# Patient Record
Sex: Female | Born: 1975 | Race: Black or African American | Hispanic: No | Marital: Married | Smoking: Never smoker
Health system: Southern US, Community
[De-identification: ages and names within clinical notes are randomized; demographics above are authoritative.]

## PROBLEM LIST (undated history)

## (undated) HISTORY — PX: KNEE SURGERY: SHX244

## (undated) HISTORY — PX: CHOLECYSTECTOMY: SHX55

---

## 2016-10-08 ENCOUNTER — Emergency Department (HOSPITAL_COMMUNITY)
Admission: EM | Admit: 2016-10-08 | Discharge: 2016-10-08 | Disposition: A | Discharge status: Home / Self Care | Payer: Medicaid Other | Attending: Emergency Medicine | Admitting: Emergency Medicine

## 2016-10-08 ENCOUNTER — Encounter (HOSPITAL_COMMUNITY): Payer: Self-pay | Admitting: Emergency Medicine

## 2016-10-08 DIAGNOSIS — H6122 Impacted cerumen, left ear: Secondary | ICD-10-CM | POA: Insufficient documentation

## 2016-10-08 DIAGNOSIS — M62838 Other muscle spasm: Secondary | ICD-10-CM | POA: Diagnosis not present

## 2016-10-08 DIAGNOSIS — H9202 Otalgia, left ear: Secondary | ICD-10-CM | POA: Diagnosis present

## 2016-10-08 MED ORDER — METHOCARBAMOL 500 MG PO TABS: 1000.0000 mg | ORAL_TABLET | Freq: Four times a day (QID) | ORAL | 0 refills | Status: DC

## 2016-10-08 NOTE — ED Notes (Signed)
ED Provider at bedside. 

## 2016-10-08 NOTE — ED Triage Notes (Signed)
Pt reports decreasing hearing in L ear over the last couple of days. Also states that she woke up with sharp pain in the back of her neck if she moves. States no pain if she does not move. Denies fevers/chills/sick contacts.

## 2016-10-08 NOTE — Discharge Instructions (Signed)
Please read and follow all provided instructions.  Your diagnoses today include:  1. Hearing loss due to cerumen impaction, left   2. Cervical paraspinal muscle spasm     Tests performed today include:  Vital signs. See below for your results today.   Medications prescribed:   Robaxin (methocarbamol) - muscle relaxer medication  DO NOT drive or perform any activities that require you to be awake and alert because this medicine can make you drowsy.   Take any prescribed medications only as directed.  Home care instructions:  Follow any educational materials contained in this packet.  Use 1:1 mixture of peroxide and water in ear canal twice daily for 15 minutes. This will help soften wax.   Follow-up instructions: Please follow-up with your primary care provider or the listed ear doctor in the next 3 days for further evaluation of your symptoms if they are not improved.   Return instructions:   Please return to the Emergency Department if you experience worsening symptoms.   Please return if you have any other emergent concerns.  Additional Information:  Your vital signs today were: BP (!) 149/98 (BP Location: Right Arm)    Pulse 90    Temp 99 F (37.2 C) (Oral)    Resp 17    Ht 5\' 11"  (1.803 m)    Wt 108.9 kg (240 lb)    LMP  (LMP Unknown)    SpO2 100%    BMI 33.47 kg/m  If your blood pressure (BP) was elevated above 135/85 this visit, please have this repeated by your doctor within one month. --------------

## 2016-10-08 NOTE — ED Provider Notes (Signed)
[facility] Provider Note   CSN: [id number] Arrival date & time: 10/08/16  [id number]     History   Chief Complaint Chief Complaint  Patient presents with  . Otalgia  . Neck Pain    HPI Ashlee Jones is a 41 y.o. female.  Patient presents with complaint of decreased hearing in her left ear for the past 5 days. She reports she used a Q-tip prior to onset. No significant pain or drainage in the ear. No treatments prior to arrival. Patient came in this morning due to a sharp sudden pain in her left neck occurring just prior to arrival. This was not associated with vision change, weakness in arms or legs. No recent injuries or manipulations reported. Patient reports that tenderness is worse with movement and palpation. No treatments prior to arrival.       No past medical history on file.  There are no active problems to display for this patient.   Past Surgical History:  Procedure Laterality Date  . CHOLECYSTECTOMY      OB History    No data available       Home Medications    Prior to Admission medications   Not on File    Family History No family history on file.  Social History Social History  Substance Use Topics  . Smoking status: Never Smoker  . Smokeless tobacco: Never Used  . Alcohol use No     Allergies   Patient has no known allergies.   Review of Systems Review of Systems  Constitutional: Negative for fever.  HENT: Positive for hearing loss. Negative for congestion, ear discharge, ear pain, rhinorrhea and sore throat.   Eyes: Negative for redness.  Respiratory: Negative for cough.   Cardiovascular: Negative for chest pain.  Gastrointestinal: Negative for abdominal pain, diarrhea, nausea and vomiting.  Genitourinary: Negative for dysuria.  Musculoskeletal: Positive for myalgias and neck pain.  Skin: Negative for rash.  Neurological: Negative for headaches.     Physical Exam Updated Vital Signs BP (!) 149/98 (BP Location:  Right Arm)   Pulse 90   Temp 99 F (37.2 C) (Oral)   Resp 17   Ht 5\' 11"  (1.803 m)   Wt 108.9 kg (240 lb)   LMP  (LMP Unknown)   SpO2 100%   BMI 33.47 kg/m   Physical Exam  Constitutional: She appears well-developed and well-nourished.  HENT:  Head: Normocephalic and atraumatic.  Right Ear: Tympanic membrane, external ear and ear canal normal.  Left Ear: External ear normal. Decreased hearing is noted.  Nose: Nose normal. No mucosal edema or rhinorrhea.  Mouth/Throat: Uvula is midline, oropharynx is clear and moist and mucous membranes are normal. Mucous membranes are not dry. No oral lesions. No trismus in the jaw. No uvula swelling. No oropharyngeal exudate, posterior oropharyngeal edema, posterior oropharyngeal erythema or tonsillar abscesses.  Cerumen impaction noted left ear canal.  Eyes: Conjunctivae are normal. Right eye exhibits no discharge. Left eye exhibits no discharge.  Neck: Normal range of motion. Neck supple.  Tenderness to the left cervical paraspinous muscles with extension to the occiput. No midline tenderness. Full range of motion of the neck but difficult with forward flexion and leftward rotation due to increased pain.  Cardiovascular: Normal rate, regular rhythm and normal heart sounds.   Pulses:      Carotid pulses are 2+ on the right side, and 2+ on the left side.      Radial pulses are 2+ on the right side,  and 2+ on the left side.  Pulmonary/Chest: Effort normal and breath sounds normal. No respiratory distress. She has no wheezes. She has no rales.  Abdominal: Soft. There is no tenderness.  Musculoskeletal:       Cervical back: She exhibits tenderness. She exhibits normal range of motion and no bony tenderness.  Lymphadenopathy:    She has no cervical adenopathy.  Neurological: She is alert.  Skin: Skin is warm and dry.  Psychiatric: She has a normal mood and affect.  Nursing note and vitals reviewed.    ED Treatments / Results    Procedures Procedures (including critical care time)  Medications Ordered in ED Medications - No data to display   Initial Impression / Assessment and Plan / ED Course  I have reviewed the triage vital signs and the nursing notes.  Pertinent labs & imaging results that were available during my care of the patient were reviewed by me and considered in my medical decision making (see chart for details).     Patient seen and examined. Ear irrigated by nurse. Impaction remained. I used a plastic cuvette under direct observation to remove 2 loopfuls of cerumen -- there is still an impaction. Will have patient soak at home with peroxide/water, f/u with ENT if not improved in several days.   Patient had several episodes of spasm in neck while I was in room. Heat pack applied. Will treat with NSAID, heat, robaxin.     Vital signs reviewed and are as follows: BP (!) 149/98 (BP Location: Right Arm)   Pulse 90   Temp 99 F (37.2 C) (Oral)   Resp 17   Ht 5\' 11"  (1.803 m)   Wt 108.9 kg (240 lb)   LMP  (LMP Unknown)   SpO2 100%   BMI 33.47 kg/m     Final Clinical Impressions(s) / ED Diagnoses   Final diagnoses:  Hearing loss due to cerumen impaction, left  Cervical paraspinal muscle spasm   Hearing loss: Obvious cerumen impaction, will treat  Paraspinous spasm: Tender on exam and with movement. No neuro deficits to suggest dissection. No concerning history. Do not feel that advanced imaging is indicated at this time unless symptoms progress.  New Prescriptions New Prescriptions   METHOCARBAMOL (ROBAXIN) 500 MG TABLET    Take 2 tablets (1,000 mg total) by mouth 4 (four) times daily.     Renne Crigler, PA-C 10/08/16 1610    Alvira Monday, MD 10/09/16 [phone]

## 2017-01-05 ENCOUNTER — Emergency Department (HOSPITAL_COMMUNITY)
Admission: EM | Admit: 2017-01-05 | Discharge: 2017-01-05 | Disposition: A | Discharge status: Home / Self Care | Payer: Medicaid Other | Attending: Emergency Medicine | Admitting: Emergency Medicine

## 2017-01-05 ENCOUNTER — Encounter (HOSPITAL_COMMUNITY): Payer: Self-pay | Admitting: Emergency Medicine

## 2017-01-05 DIAGNOSIS — M542 Cervicalgia: Secondary | ICD-10-CM | POA: Diagnosis not present

## 2017-01-05 DIAGNOSIS — M25512 Pain in left shoulder: Secondary | ICD-10-CM

## 2017-01-05 MED ORDER — TRAMADOL HCL 50 MG PO TABS: 50.0000 mg | ORAL_TABLET | Freq: Four times a day (QID) | ORAL | 0 refills | Status: DC | PRN

## 2017-01-05 MED ORDER — CYCLOBENZAPRINE HCL 10 MG PO TABS: 10.0000 mg | ORAL_TABLET | Freq: Two times a day (BID) | ORAL | 0 refills | Status: DC | PRN

## 2017-01-05 NOTE — ED Notes (Signed)
C/o neck, left arm "aching" pain since 8/16. No known injury. Denies SOB, CP or nausea. Pain not related to movement.

## 2017-01-05 NOTE — ED Provider Notes (Signed)
[facility] Provider Note   CSN: [id number] Arrival date & time: 01/05/17  1009     History   Chief Complaint Chief Complaint  Patient presents with  . Neck Pain    HPI Ashlee Jones is a 41 y.o. female who presents with left sided jaw, ear, neck, shoulder, and arm pain. No significant PMH. She states that the pain started about 5 days ago. It started in the jaw and ear and moved down and now it is primarily in the left upper chest, shoulder and arm. She also had some pain in the left leg but this went away. She states she was here for similar symptoms in May and diagnosed with a cerumen impaction and had pain then as well. It gradually got better on its own. She denies headache, numbness, tingling, weakness, injury. She denies dental pain, decreased hearing, ear drainage. The pain is worse with movement and feels like an aching/stiffness. She has been taking Ibuprofen without relief. She is not working or lifting heavy things and doesn't recall sleeping wrong.   HPI  History reviewed. No pertinent past medical history.  There are no active problems to display for this patient.   Past Surgical History:  Procedure Laterality Date  . CHOLECYSTECTOMY      OB History    No data available       Home Medications    Prior to Admission medications   Medication Sig Start Date End Date Taking? Authorizing Provider  cyclobenzaprine (FLEXERIL) 10 MG tablet Take 1 tablet (10 mg total) by mouth 2 (two) times daily as needed for muscle spasms. 01/05/17   Bethel Born, PA-C  methocarbamol (ROBAXIN) 500 MG tablet Take 2 tablets (1,000 mg total) by mouth 4 (four) times daily. 10/08/16   Renne Crigler, PA-C  traMADol (ULTRAM) 50 MG tablet Take 1 tablet (50 mg total) by mouth every 6 (six) hours as needed. 01/05/17   Bethel Born, PA-C    Family History History reviewed. No pertinent family history.  Social History Social History  Substance Use Topics  . Smoking  status: Never Smoker  . Smokeless tobacco: Never Used  . Alcohol use No     Allergies   Patient has no known allergies.   Review of Systems Review of Systems  Constitutional: Negative for fever.  HENT: Positive for ear pain. Negative for congestion, dental problem, facial swelling, rhinorrhea and sore throat.   Respiratory: Negative for shortness of breath.   Cardiovascular: Negative for chest pain.  Musculoskeletal: Positive for arthralgias, myalgias, neck pain and neck stiffness. Negative for back pain, gait problem and joint swelling.  Neurological: Negative for weakness, numbness and headaches.     Physical Exam Updated Vital Signs BP (!) 161/119 (BP Location: Left Arm)   Pulse 87   Temp 98.4 F (36.9 C) (Oral)   Resp 18   Ht 5\' 11"  (1.803 m)   Wt 99.8 kg (220 lb)   SpO2 97%   BMI 30.68 kg/m   Physical Exam  Constitutional: She is oriented to person, place, and time. She appears well-developed and well-nourished. No distress.  Calm, cooperative, tearful at times  HENT:  Head: Normocephalic and atraumatic.  Right Ear: Hearing, tympanic membrane, external ear and ear canal normal.  Left Ear: Hearing, external ear and ear canal normal.  Cerumen impaction on the left  Eyes: Pupils are equal, round, and reactive to light. Conjunctivae are normal. Right eye exhibits no discharge. Left eye exhibits no discharge. No scleral  icterus.  Neck: Normal range of motion.  No midline tenderness. Left sided neck tenderness  Cardiovascular: Normal rate and regular rhythm.  Exam reveals no gallop and no friction rub.   No murmur heard. Pulmonary/Chest: Effort normal and breath sounds normal. No respiratory distress. She has no wheezes. She has no rales. She exhibits no tenderness.  Abdominal: She exhibits no distension.  Musculoskeletal:  Left shoulder: No obvious swelling, deformity, or warmth. Tenderness to palpation of left trapezius, left lateral shoulder, left upper chest wall.  FROM. 5/5 strength. N/V intact.   Neurological: She is alert and oriented to person, place, and time.  Skin: Skin is warm and dry.  Psychiatric: She has a normal mood and affect. Her behavior is normal.  Nursing note and vitals reviewed.    ED Treatments / Results  Labs (all labs ordered are listed, but only abnormal results are displayed) Labs Reviewed - No data to display  EKG  EKG Interpretation None       Radiology No results found.  Procedures Procedures (including critical care time)  Medications Ordered in ED Medications - No data to display   Initial Impression / Assessment and Plan / ED Course  I have reviewed the triage vital signs and the nursing notes.  Pertinent labs & imaging results that were available during my care of the patient were reviewed by me and considered in my medical decision making (see chart for details).  41 year old female presents with MSK pain of unclear etiology. She is hypertensive but otherwise vitals are normal. She has no weakness on exam. She does have cerumen impaction - offered irrigation but she declined saying she will do this at home. I advised continuing NSAIDS, heat, muscle relaxer, and will add Tramadol. I also advised if this is continuing to follow up with orthopedics.   Final Clinical Impressions(s) / ED Diagnoses   Final diagnoses:  Neck pain on left side  Acute pain of left shoulder    New Prescriptions New Prescriptions   CYCLOBENZAPRINE (FLEXERIL) 10 MG TABLET    Take 1 tablet (10 mg total) by mouth 2 (two) times daily as needed for muscle spasms.   TRAMADOL (ULTRAM) 50 MG TABLET    Take 1 tablet (50 mg total) by mouth every 6 (six) hours as needed.     Bethel Born, PA-C 01/05/17 1137    Geoffery Lyons, MD 01/06/17 [phone]

## 2017-01-05 NOTE — ED Triage Notes (Signed)
Pt sts left sided ear, neck and shoulder pain and soreness worse with movement upon waking today

## 2017-01-05 NOTE — Discharge Instructions (Signed)
Take Ibuprofen three times daily for the next week. Take this medicine with food. Take Tramadol for moderate-severe pain Take muscle relaxer at bedtime to help you sleep. This medicine makes you drowsy so do not take before driving or work Use a heating pad for sore muscles - use for 20 minutes several times a day Follow up with orthopedics

## 2017-01-08 DIAGNOSIS — Z9049 Acquired absence of other specified parts of digestive tract: Secondary | ICD-10-CM | POA: Diagnosis not present

## 2017-01-08 DIAGNOSIS — M25512 Pain in left shoulder: Secondary | ICD-10-CM | POA: Diagnosis present

## 2017-01-08 DIAGNOSIS — M62830 Muscle spasm of back: Secondary | ICD-10-CM | POA: Diagnosis not present

## 2017-01-09 ENCOUNTER — Emergency Department (HOSPITAL_COMMUNITY)
Admission: EM | Admit: 2017-01-09 | Discharge: 2017-01-09 | Disposition: A | Discharge status: Home / Self Care | Payer: Medicaid Other | Attending: Emergency Medicine | Admitting: Emergency Medicine

## 2017-01-09 ENCOUNTER — Other Ambulatory Visit: Payer: Self-pay

## 2017-01-09 ENCOUNTER — Encounter (HOSPITAL_COMMUNITY): Payer: Self-pay | Admitting: Emergency Medicine

## 2017-01-09 ENCOUNTER — Emergency Department (HOSPITAL_COMMUNITY): Payer: Medicaid Other

## 2017-01-09 DIAGNOSIS — M62838 Other muscle spasm: Secondary | ICD-10-CM

## 2017-01-09 LAB — COMPREHENSIVE METABOLIC PANEL
ALK PHOS: 56 U/L (ref 38–126)
ALT: 20 U/L (ref 14–54)
ANION GAP: 8 (ref 5–15)
AST: 17 U/L (ref 15–41)
Albumin: 3.8 g/dL (ref 3.5–5.0)
BUN: 11 mg/dL (ref 6–20)
CO2: 21 mmol/L — ABNORMAL LOW (ref 22–32)
Calcium: 8.9 mg/dL (ref 8.9–10.3)
Chloride: 106 mmol/L (ref 101–111)
Creatinine, Ser: 0.81 mg/dL (ref 0.44–1.00)
GLUCOSE: 99 mg/dL (ref 65–99)
Potassium: 4 mmol/L (ref 3.5–5.1)
Sodium: 135 mmol/L (ref 135–145)
TOTAL PROTEIN: 8 g/dL (ref 6.5–8.1)
Total Bilirubin: 0.8 mg/dL (ref 0.3–1.2)

## 2017-01-09 LAB — CBC WITH DIFFERENTIAL/PLATELET
Basophils Absolute: 0 10*3/uL (ref 0.0–0.1)
Basophils Relative: 0 %
Eosinophils Absolute: 0.1 10*3/uL (ref 0.0–0.7)
Eosinophils Relative: 2 %
HEMATOCRIT: 42.3 % (ref 36.0–46.0)
HEMOGLOBIN: 14.4 g/dL (ref 12.0–15.0)
LYMPHS ABS: 2.2 10*3/uL (ref 0.7–4.0)
Lymphocytes Relative: 33 %
MCH: 31.4 pg (ref 26.0–34.0)
MCHC: 34 g/dL (ref 30.0–36.0)
MCV: 92.2 fL (ref 78.0–100.0)
MONOS PCT: 9 %
Monocytes Absolute: 0.6 10*3/uL (ref 0.1–1.0)
NEUTROS ABS: 3.7 10*3/uL (ref 1.7–7.7)
NEUTROS PCT: 56 %
Platelets: 283 10*3/uL (ref 150–400)
RBC: 4.59 MIL/uL (ref 3.87–5.11)
RDW: 12.3 % (ref 11.5–15.5)
WBC: 6.6 10*3/uL (ref 4.0–10.5)

## 2017-01-09 LAB — I-STAT TROPONIN, ED: Troponin i, poc: 0.03 ng/mL (ref 0.00–0.08)

## 2017-01-09 MED ORDER — DICLOFENAC SODIUM 1 % TD GEL: 2.0000 g | Freq: Four times a day (QID) | TRANSDERMAL | 0 refills | Status: DC

## 2017-01-09 MED ORDER — METHOCARBAMOL 500 MG PO TABS: 1000.0000 mg | ORAL_TABLET | Freq: Four times a day (QID) | ORAL | 0 refills | Status: DC | PRN

## 2017-01-09 MED ORDER — BUPIVACAINE HCL (PF) 0.5 % IJ SOLN
10.0000 mL | Freq: Once | INTRAMUSCULAR | Status: DC
  Filled 2017-01-09: qty 10

## 2017-01-09 NOTE — ED Provider Notes (Signed)
[facility] Provider Note   CSN: [id number] Arrival date & time: 01/08/17  2329     History   Chief Complaint Chief Complaint  Patient presents with  . Shoulder Pain    HPI  Blood pressure 140/87, pulse 89, temperature 98.3 F (36.8 C), temperature source Oral, resp. rate (!) 22, height 5\' 11"  (1.803 m), weight 99.8 kg (220 lb), SpO2 100 %.  Ashlee Jones is a 41 y.o. female complaining of left posterior shoulder pain exacerbated by movement onset 2 weeks ago. There is no associated trauma, chest pain, shortness of breath, palpitations. She's was seen for similar and given a prescription for Flexeril and tramadol and it hasn't really helped. She describes it as colicky, aching.    History reviewed. No pertinent past medical history.  There are no active problems to display for this patient.   Past Surgical History:  Procedure Laterality Date  . CHOLECYSTECTOMY      OB History    No data available       Home Medications    Prior to Admission medications   Medication Sig Start Date End Date Taking? Authorizing Provider  cyclobenzaprine (FLEXERIL) 10 MG tablet Take 1 tablet (10 mg total) by mouth 2 (two) times daily as needed for muscle spasms. 01/05/17   Bethel Born, PA-C  diclofenac sodium (VOLTAREN) 1 % GEL Apply 2 g topically 4 (four) times daily. 01/09/17   Dontarius Sheley, Joni Reining, PA-C  methocarbamol (ROBAXIN) 500 MG tablet Take 2 tablets (1,000 mg total) by mouth 4 (four) times daily as needed (Pain). 01/09/17   Jashan Cotten, Joni Reining, PA-C  traMADol (ULTRAM) 50 MG tablet Take 1 tablet (50 mg total) by mouth every 6 (six) hours as needed. 01/05/17   Bethel Born, PA-C    Family History No family history on file.  Social History Social History  Substance Use Topics  . Smoking status: Never Smoker  . Smokeless tobacco: Never Used  . Alcohol use No     Allergies   Patient has no known allergies.   Review of Systems Review of Systems  A  complete review of systems was obtained and all systems are negative except as noted in the HPI and PMH.   Physical Exam Updated Vital Signs BP 140/87 (BP Location: Right Arm)   Pulse 89   Temp 98.3 F (36.8 C) (Oral)   Resp (!) 22   Ht 5\' 11"  (1.803 m)   Wt 99.8 kg (220 lb)   SpO2 100%   BMI 30.68 kg/m   Physical Exam  Constitutional: She is oriented to person, place, and time. She appears well-developed and well-nourished. No distress.  HENT:  Head: Normocephalic and atraumatic.  Mouth/Throat: Oropharynx is clear and moist.  Eyes: Pupils are equal, round, and reactive to light. Conjunctivae and EOM are normal.  Neck: Normal range of motion.  Cardiovascular: Normal rate, regular rhythm and intact distal pulses.   Pulmonary/Chest: Effort normal and breath sounds normal. No respiratory distress. She has no wheezes. She has no rales. She exhibits no tenderness.  Abdominal: Soft. There is no tenderness.  Musculoskeletal: Normal range of motion. She exhibits tenderness.       Arms: Trapezius muscle spasm as diagrammed.  Left shoulder: Shoulder with no deformity. FROM to shoulder and elbow. No TTP of rotator cuff musculature. Drop arm negative. Neurovascularly intact   Neurological: She is alert and oriented to person, place, and time.  Skin: She is not diaphoretic.  Psychiatric: She has a normal mood  and affect.  Nursing note and vitals reviewed.    ED Treatments / Results  Labs (all labs ordered are listed, but only abnormal results are displayed) Labs Reviewed  COMPREHENSIVE METABOLIC PANEL - Abnormal; Notable for the following:       Result Value   CO2 21 (*)    All other components within normal limits  CBC WITH DIFFERENTIAL/PLATELET  I-STAT TROPONIN, ED    EKG  EKG Interpretation  Date/Time:  Saturday January 09 2017 07:20:04 EDT Ventricular Rate:  86 PR Interval:    QRS Duration: 85 QT Interval:  368 QTC Calculation: 441 R Axis:   44 Text Interpretation:   Sinus rhythm Confirmed by Rochele Raring [phone]) on 01/09/2017 7:28:42 AM Also confirmed by Ward, Baxter Hire [phone]), editor Misty Stanley [phone])  on 01/09/2017 7:41:01 AM       Radiology Dg Chest 2 View  Result Date: 01/09/2017 CLINICAL DATA:  Right shoulder pain for 9 days.  No trauma. EXAM: CHEST  2 VIEW COMPARISON:  None. FINDINGS: The heart size and mediastinal contours are within normal limits. Both lungs are clear. The visualized skeletal structures are unremarkable. IMPRESSION: No active cardiopulmonary disease. Electronically Signed   By: Gerome Sam III M.D   On: 01/09/2017 07:07   Dg Shoulder Left  Result Date: 01/09/2017 CLINICAL DATA:  Left shoulder pain for 9 days.  No known injury. EXAM: LEFT SHOULDER - 2+ VIEW COMPARISON:  None. FINDINGS: There is no evidence of fracture or dislocation. There is no evidence of arthropathy or other focal bone abnormality. Soft tissues are unremarkable. IMPRESSION: Negative radiographs of the left shoulder. Electronically Signed   By: Rubye Oaks M.D.   On: 01/09/2017 01:00    Procedures Procedures (including critical care time)  A trigger point injection was performed at the site of maximal tenderness using 3cc 0.5% bupivacaine without epinephrine. This was well tolerated, and followed by relief of pain.  Medications Ordered in ED Medications  bupivacaine (MARCAINE) 0.5 % injection 10 mL (not administered)     Initial Impression / Assessment and Plan / ED Course  I have reviewed the triage vital signs and the nursing notes.  Pertinent labs & imaging results that were available during my care of the patient were reviewed by me and considered in my medical decision making (see chart for details).     Vitals:   01/09/17 0008 01/09/17 0456 01/09/17 0613 01/09/17 0724  BP:  138/81 129/79 140/87  Pulse:  86 82 89  Resp:  16 16 (!) 22  Temp:  98.3 F (36.8 C)    TempSrc:  Oral    SpO2:  100% 100% 100%  Weight: 99.8 kg  (220 lb)     Height: 5\' 11"  (1.803 m)       Medications  bupivacaine (MARCAINE) 0.5 % injection 10 mL (not administered)    Ashlee Jones is 41 y.o. female presenting with left shoulder pain persistent onset 1 week ago focally tender along the trapezius muscle. Cardiac workup out of an abundance of caution negative and reassuring. Trigger point injection performed with good relief. Patient given prescription for Robaxin and Voltaren.  Evaluation does not show pathology that would require ongoing emergent intervention or inpatient treatment. Pt is hemodynamically stable and mentating appropriately. Discussed findings and plan with patient/guardian, who agrees with care plan. All questions answered. Return precautions discussed and outpatient follow up given.    Final Clinical Impressions(s) / ED Diagnoses   Final diagnoses:  Trapezius muscle spasm  New Prescriptions New Prescriptions   DICLOFENAC SODIUM (VOLTAREN) 1 % GEL    Apply 2 g topically 4 (four) times daily.   METHOCARBAMOL (ROBAXIN) 500 MG TABLET    Take 2 tablets (1,000 mg total) by mouth 4 (four) times daily as needed (Pain).     Kaylyn Lim 01/09/17 0756    Lorre Nick, MD 01/09/17 1320

## 2017-01-09 NOTE — ED Triage Notes (Signed)
Reports having pain in shoulder since 8/16.  Was seen here previously and given toradol and muscle relaxers.  Reports no relief in pain.

## 2017-01-09 NOTE — Discharge Instructions (Signed)
For pain control you may take up to 800mg of Motrin (also known as ibuprofen). That is usually 4 over the counter pills,  3 times a day. Take with food to minimize stomach irritation  ° °You can also take  tylenol (acetaminophen) 975mg (this is 3 over the counter pills) four times a day. Do not drink alcohol or combine with other medications that have acetaminophen as an ingredient (Read the labels!).   ° °For breakthrough pain you may take Robaxin. Do not drink alcohol, drive or operate heavy machinery when taking Robaxin. ° °Do not hesitate to return to the emergency room for any new, worsening or concerning symptoms. ° °Please obtain primary care using resource guide below. Let them know that you were seen in the emergency room and that they will need to obtain records for further outpatient management. ° ° °

## 2017-01-15 ENCOUNTER — Other Ambulatory Visit: Payer: Self-pay

## 2017-01-15 ENCOUNTER — Emergency Department (HOSPITAL_COMMUNITY): Payer: Medicaid Other

## 2017-01-15 ENCOUNTER — Encounter (HOSPITAL_COMMUNITY): Payer: Self-pay | Admitting: Emergency Medicine

## 2017-01-15 DIAGNOSIS — R079 Chest pain, unspecified: Secondary | ICD-10-CM | POA: Insufficient documentation

## 2017-01-15 DIAGNOSIS — Z5321 Procedure and treatment not carried out due to patient leaving prior to being seen by health care provider: Secondary | ICD-10-CM | POA: Insufficient documentation

## 2017-01-15 LAB — CBC
HEMATOCRIT: 37.6 % (ref 36.0–46.0)
HEMOGLOBIN: 12.6 g/dL (ref 12.0–15.0)
MCH: 31.3 pg (ref 26.0–34.0)
MCHC: 33.5 g/dL (ref 30.0–36.0)
MCV: 93.3 fL (ref 78.0–100.0)
Platelets: 292 10*3/uL (ref 150–400)
RBC: 4.03 MIL/uL (ref 3.87–5.11)
RDW: 12.1 % (ref 11.5–15.5)
WBC: 6.8 10*3/uL (ref 4.0–10.5)

## 2017-01-15 LAB — I-STAT TROPONIN, ED: Troponin i, poc: 0 ng/mL (ref 0.00–0.08)

## 2017-01-15 LAB — BASIC METABOLIC PANEL
ANION GAP: 6 (ref 5–15)
BUN: 8 mg/dL (ref 6–20)
CO2: 26 mmol/L (ref 22–32)
Calcium: 8.5 mg/dL — ABNORMAL LOW (ref 8.9–10.3)
Chloride: 105 mmol/L (ref 101–111)
Creatinine, Ser: 0.64 mg/dL (ref 0.44–1.00)
Glucose, Bld: 111 mg/dL — ABNORMAL HIGH (ref 65–99)
POTASSIUM: 3.4 mmol/L — AB (ref 3.5–5.1)
SODIUM: 137 mmol/L (ref 135–145)

## 2017-01-15 NOTE — ED Triage Notes (Addendum)
Pt presents POV c/o L sided CP onset 0800 while in class. Non radiating ache initially then became sharp as the day progressed. Pt states she was seen recently for left sided trap spasm Pt denies n/v, + shob, denies diaphoresis.  Pt took alka seltzer today with no relief.

## 2017-01-16 ENCOUNTER — Emergency Department (HOSPITAL_COMMUNITY)
Admission: EM | Admit: 2017-01-16 | Discharge: 2017-01-16 | Disposition: A | Discharge status: Home / Self Care | Payer: Medicaid Other | Attending: Emergency Medicine | Admitting: Emergency Medicine

## 2017-01-16 NOTE — ED Notes (Signed)
Pt called for reassessment, no response. Pt is not see in the lobby

## 2017-09-19 ENCOUNTER — Emergency Department (HOSPITAL_COMMUNITY): Payer: Medicaid Other

## 2017-09-19 ENCOUNTER — Other Ambulatory Visit: Payer: Self-pay

## 2017-09-19 ENCOUNTER — Encounter (HOSPITAL_COMMUNITY): Payer: Self-pay | Admitting: Emergency Medicine

## 2017-09-19 ENCOUNTER — Emergency Department (HOSPITAL_COMMUNITY)
Admission: EM | Admit: 2017-09-19 | Discharge: 2017-09-19 | Disposition: A | Discharge status: Home / Self Care | Payer: Medicaid Other | Attending: Emergency Medicine | Admitting: Emergency Medicine

## 2017-09-19 DIAGNOSIS — R1013 Epigastric pain: Secondary | ICD-10-CM | POA: Diagnosis present

## 2017-09-19 DIAGNOSIS — R1011 Right upper quadrant pain: Secondary | ICD-10-CM | POA: Diagnosis not present

## 2017-09-19 LAB — URINALYSIS, ROUTINE W REFLEX MICROSCOPIC
Bilirubin Urine: NEGATIVE
Glucose, UA: NEGATIVE mg/dL
KETONES UR: 20 mg/dL — AB
Leukocytes, UA: NEGATIVE
NITRITE: NEGATIVE
PH: 5 (ref 5.0–8.0)
Protein, ur: NEGATIVE mg/dL
Specific Gravity, Urine: 1.024 (ref 1.005–1.030)

## 2017-09-19 LAB — COMPREHENSIVE METABOLIC PANEL
ALBUMIN: 3.7 g/dL (ref 3.5–5.0)
ALT: 19 U/L (ref 14–54)
ANION GAP: 8 (ref 5–15)
AST: 16 U/L (ref 15–41)
Alkaline Phosphatase: 67 U/L (ref 38–126)
BUN: 5 mg/dL — ABNORMAL LOW (ref 6–20)
CHLORIDE: 104 mmol/L (ref 101–111)
CO2: 24 mmol/L (ref 22–32)
Calcium: 8.6 mg/dL — ABNORMAL LOW (ref 8.9–10.3)
Creatinine, Ser: 0.68 mg/dL (ref 0.44–1.00)
GFR calc non Af Amer: >60 mL/min (ref >60)
GLUCOSE: 109 mg/dL — AB (ref 65–99)
Potassium: 3.6 mmol/L (ref 3.5–5.1)
Sodium: 136 mmol/L (ref 135–145)
Total Bilirubin: 1 mg/dL (ref 0.3–1.2)
Total Protein: 7.4 g/dL (ref 6.5–8.1)

## 2017-09-19 LAB — I-STAT BETA HCG BLOOD, ED (MC, WL, AP ONLY): I-stat hCG, quantitative: <5 m[IU]/mL (ref <5)

## 2017-09-19 LAB — CBC
HEMATOCRIT: 39.8 % (ref 36.0–46.0)
HEMOGLOBIN: 13.4 g/dL (ref 12.0–15.0)
MCH: 31.2 pg (ref 26.0–34.0)
MCHC: 33.7 g/dL (ref 30.0–36.0)
MCV: 92.6 fL (ref 78.0–100.0)
Platelets: 301 10*3/uL (ref 150–400)
RBC: 4.3 MIL/uL (ref 3.87–5.11)
RDW: 12.2 % (ref 11.5–15.5)
WBC: 5.5 10*3/uL (ref 4.0–10.5)

## 2017-09-19 LAB — LIPASE, BLOOD: LIPASE: 32 U/L (ref 11–51)

## 2017-09-19 MED ORDER — FAMOTIDINE IN NACL 20-0.9 MG/50ML-% IV SOLN
20.0000 mg | Freq: Once | INTRAVENOUS | Status: AC
  Administered 2017-09-19: 20 mg via INTRAVENOUS
  Filled 2017-09-19: qty 50

## 2017-09-19 MED ORDER — FAMOTIDINE 20 MG PO TABS: 20.0000 mg | ORAL_TABLET | Freq: Two times a day (BID) | ORAL | 0 refills | Status: DC

## 2017-09-19 NOTE — Discharge Instructions (Addendum)
Your evaluated in the emergency department for abdominal pain.  You had blood work and a urine test that did not show an obvious cause of your symptoms.  You are improved with some acid medication and we are prescribing you this to continue for the next couple of weeks.  You should follow-up with your doctor and if you have done work on getting a doctor.  Return if any worsening symptoms.

## 2017-09-19 NOTE — ED Provider Notes (Signed)
[facility] EMERGENCY DEPARTMENT Provider Note   CSN: [id number] Arrival date & time: 09/19/17  0272     History   Chief Complaint Chief Complaint  Patient presents with  . Abdominal Pain    HPI Ashlee Jones is a 42 y.o. female.  She is complaining of mid epigastric and right-sided abdominal pain since 10 PM last night.  States is burning in nature and radiates through to her back.  She feels some abdominal distention.  She said she had a normal bowel movement yesterday.  Is been nausea but no vomiting.  No urinary symptoms no fever.  No vaginal discharge or bleeding.  She tried some Kaopectate with some relief.  No different foods or activities.  Pain is slightly improved but continues.  The history is provided by the patient.  Abdominal Pain   This is a new problem. The current episode started 6 to 12 hours ago. The problem occurs constantly. The problem has been gradually improving. The pain is located in the epigastric region. The quality of the pain is burning. The pain is moderate. Associated symptoms include nausea. Pertinent negatives include fever, diarrhea, vomiting, constipation, dysuria, frequency, hematuria, headaches and arthralgias. Nothing aggravates the symptoms. Nothing relieves the symptoms.    History reviewed. No pertinent past medical history.  There are no active problems to display for this patient.   Past Surgical History:  Procedure Laterality Date  . CHOLECYSTECTOMY       OB History   None      Home Medications    Prior to Admission medications   Medication Sig Start Date End Date Taking? Authorizing Provider  cyclobenzaprine (FLEXERIL) 10 MG tablet Take 1 tablet (10 mg total) by mouth 2 (two) times daily as needed for muscle spasms. Patient not taking: Reported on 09/19/2017 01/05/17   Bethel Born, PA-C  diclofenac sodium (VOLTAREN) 1 % GEL Apply 2 g topically 4 (four) times daily. Patient not taking: Reported on  09/19/2017 01/09/17   Pisciotta, Joni Reining, PA-C  methocarbamol (ROBAXIN) 500 MG tablet Take 2 tablets (1,000 mg total) by mouth 4 (four) times daily as needed (Pain). Patient not taking: Reported on 09/19/2017 01/09/17   Pisciotta, Joni Reining, PA-C  traMADol (ULTRAM) 50 MG tablet Take 1 tablet (50 mg total) by mouth every 6 (six) hours as needed. Patient not taking: Reported on 09/19/2017 01/05/17   Bethel Born, PA-C    Family History No family history on file.  Social History Social History   Tobacco Use  . Smoking status: Never Smoker  . Smokeless tobacco: Never Used  Substance Use Topics  . Alcohol use: No  . Drug use: No     Allergies   Patient has no known allergies.   Review of Systems Review of Systems  Constitutional: Negative for chills and fever.  HENT: Negative for ear pain and sore throat.   Eyes: Negative for pain and visual disturbance.  Respiratory: Negative for cough and shortness of breath.   Cardiovascular: Negative for chest pain and palpitations.  Gastrointestinal: Positive for abdominal pain and nausea. Negative for constipation, diarrhea and vomiting.  Genitourinary: Negative for dysuria, frequency and hematuria.  Musculoskeletal: Negative for arthralgias and back pain.  Skin: Negative for color change and rash.  Neurological: Negative for seizures, syncope and headaches.  All other systems reviewed and are negative.    Physical Exam Updated Vital Signs BP (!) 154/112 (BP Location: Right Arm)   Temp 98.3 F (36.8 C) (Oral)  Resp 18   SpO2 100%   Physical Exam  Constitutional: She appears well-developed and well-nourished. No distress.  HENT:  Head: Normocephalic and atraumatic.  Eyes: Conjunctivae are normal.  Neck: Neck supple.  Cardiovascular: Normal rate and regular rhythm.  No murmur heard. Pulmonary/Chest: Effort normal and breath sounds normal. No respiratory distress.  Abdominal: Soft. There is no tenderness.  Musculoskeletal: She  exhibits no edema, tenderness or deformity.  Neurological: She is alert. She has normal strength. No cranial nerve deficit or sensory deficit. GCS eye subscore is 4. GCS verbal subscore is 5. GCS motor subscore is 6.  Skin: Skin is warm and dry.  Psychiatric: She has a normal mood and affect.  Nursing note and vitals reviewed.    ED Treatments / Results  Labs (all labs ordered are listed, but only abnormal results are displayed) Labs Reviewed  COMPREHENSIVE METABOLIC PANEL - Abnormal; Notable for the following components:      Result Value   Glucose, Bld 109 (*)    BUN 5 (*)    Calcium 8.6 (*)    All other components within normal limits  URINALYSIS, ROUTINE W REFLEX MICROSCOPIC - Abnormal; Notable for the following components:   APPearance HAZY (*)    Hgb urine dipstick MODERATE (*)    Ketones, ur 20 (*)    Bacteria, UA RARE (*)    All other components within normal limits  LIPASE, BLOOD  CBC  I-STAT BETA HCG BLOOD, ED ([facility], [facility], [facility] ONLY)  I-STAT TROPONIN, ED    EKG EKG Interpretation  Date/Time:  /05/19 1610  Terrilee Files, MD 09/20/17 [phone]

## 2017-09-19 NOTE — ED Notes (Signed)
Patient verbalizes understanding of discharge instructions. Opportunity for questioning and answers were provided. Armband removed by staff, pt discharged from ED.  

## 2017-09-19 NOTE — ED Triage Notes (Signed)
Pt states she started having mid epigastric burning that radiates to her right side last night.  Took some kaopectate and did not get any relief. Nausea but no vomiting.

## 2018-07-10 ENCOUNTER — Emergency Department (HOSPITAL_COMMUNITY)
Admission: EM | Admit: 2018-07-10 | Discharge: 2018-07-10 | Disposition: A | Discharge status: Home / Self Care | Payer: Medicaid Other | Attending: Emergency Medicine | Admitting: Emergency Medicine

## 2018-07-10 ENCOUNTER — Encounter (HOSPITAL_COMMUNITY): Payer: Self-pay

## 2018-07-10 DIAGNOSIS — J111 Influenza due to unidentified influenza virus with other respiratory manifestations: Secondary | ICD-10-CM | POA: Insufficient documentation

## 2018-07-10 DIAGNOSIS — R6889 Other general symptoms and signs: Secondary | ICD-10-CM

## 2018-07-10 DIAGNOSIS — Z79899 Other long term (current) drug therapy: Secondary | ICD-10-CM | POA: Insufficient documentation

## 2018-07-10 MED ORDER — ONDANSETRON 4 MG PO TBDP: 4.0000 mg | ORAL_TABLET | Freq: Four times a day (QID) | ORAL | 0 refills | Status: DC | PRN

## 2018-07-10 MED ORDER — OSELTAMIVIR PHOSPHATE 75 MG PO CAPS: 75.0000 mg | ORAL_CAPSULE | Freq: Two times a day (BID) | ORAL | 0 refills | Status: DC

## 2018-07-10 MED ORDER — ACETAMINOPHEN 500 MG PO TABS
1000.0000 mg | ORAL_TABLET | Freq: Once | ORAL | Status: AC
  Administered 2018-07-10: 1000 mg via ORAL
  Filled 2018-07-10: qty 2

## 2018-07-10 NOTE — Discharge Instructions (Signed)
You may alternate Tylenol 1000 mg every 6 hours as needed for fever and pain and ibuprofen 800 mg every 8 hours as needed for fever and pain. Please rest and drink plenty of fluids. This is a viral illness causing your symptoms. You do not need antibiotics for a virus. You may use over-the-counter nasal saline spray and Afrin nasal saline spray as needed for nasal congestion. Please do not use Afrin for more than 3 days in a row. You may use guaifenesin and dextromethorphan as needed for cough.  You may use lozenges and Chloraseptic spray to help with sore throat.  Warm salt water gargles can also help with sore throat.  You may use over-the-counter Unisom (doxyalamine) or Benadryl to help with sleep.  Please note that some combination medicines such as DayQuil and NyQuil have multiple medications in them.  Please make sure you look at all labels to ensure that you are not taking too much of any one particular medication.  Symptoms from a virus may take 7-14 days to run its course.  We do not test for the flu from the emergency department as it takes hours for this test to come back and it would not change our management. The flu is treated like any other virus with supportive measures as listed above.  There is a medication called Tamiflu which has been shown to shorten the course of the flu by approximately 12 hours.  Tamiflu has to be taken within the first 48 hours of symptoms.  Tamiflu has many side effects including nausea, vomiting and diarrhea.

## 2018-07-10 NOTE — ED Provider Notes (Signed)
TIME SEEN: 5:31 AM  CHIEF COMPLAINT: Flulike symptoms  HPI: Patient is a 43 year old female with no significant past medical history who presents to the emergency department with 1 day of flulike symptoms.  Reports she has had chills, cough, headache, body aches, sore throat today.  Works at [employer] where multiple people have been sick.  Did not have a flu shot this year.  No travel outside of the country.  No vomiting or diarrhea.  No neck pain or neck stiffness.  ROS: See HPI Constitutional: Subjective fever  Eyes: no drainage  ENT: no runny nose   Cardiovascular:  no chest pain  Resp: no SOB  GI: no vomiting GU: no dysuria Integumentary: no rash  Allergy: no hives  Musculoskeletal: no leg swelling  Neurological: no slurred speech ROS otherwise negative  PAST MEDICAL HISTORY/PAST SURGICAL HISTORY:  History reviewed. No pertinent past medical history.  MEDICATIONS:  Prior to Admission medications   Medication Sig Start Date End Date Taking? Authorizing Provider  cyclobenzaprine (FLEXERIL) 10 MG tablet Take 1 tablet (10 mg total) by mouth 2 (two) times daily as needed for muscle spasms. Patient not taking: Reported on 09/19/2017 01/05/17   Bethel Born, PA-C  diclofenac sodium (VOLTAREN) 1 % GEL Apply 2 g topically 4 (four) times daily. Patient not taking: Reported on 09/19/2017 01/09/17   Pisciotta, Joni Reining, PA-C  famotidine (PEPCID) 20 MG tablet Take 1 tablet (20 mg total) by mouth 2 (two) times daily. 09/19/17   Terrilee Files, MD  methocarbamol (ROBAXIN) 500 MG tablet Take 2 tablets (1,000 mg total) by mouth 4 (four) times daily as needed (Pain). Patient not taking: Reported on 09/19/2017 01/09/17   Pisciotta, Joni Reining, PA-C  traMADol (ULTRAM) 50 MG tablet Take 1 tablet (50 mg total) by mouth every 6 (six) hours as needed. Patient not taking: Reported on 09/19/2017 01/05/17   Bethel Born, PA-C    ALLERGIES:  No Known Allergies  SOCIAL HISTORY:  Social  History   Tobacco Use  . Smoking status: Never Smoker  . Smokeless tobacco: Never Used  Substance Use Topics  . Alcohol use: No    FAMILY HISTORY: No family history on file.  EXAM: BP 115/76 (BP Location: Right Arm)   Pulse 96   Temp 98.6 F (37 C) (Oral)   Resp (!) 21   SpO2 100%  CONSTITUTIONAL: Alert and oriented and responds appropriately to questions. Well-appearing; well-nourished HEAD: Normocephalic EYES: Conjunctivae clear, pupils appear equal, EOMI ENT: normal nose; moist mucous membranes; No pharyngeal erythema or petechiae, no tonsillar hypertrophy or exudate, no uvular deviation, no unilateral swelling, no trismus or drooling, no muffled voice, normal phonation, no stridor, no dental caries present, no drainable dental abscess noted, no Ludwig's angina, tongue sits flat in the bottom of the mouth, no angioedema, no facial erythema or warmth, no facial swelling; no pain with movement of the neck. NECK: Supple, no meningismus, no nuchal rigidity, no LAD  CARD: RRR; S1 and S2 appreciated; no murmurs, no clicks, no rubs, no gallops RESP: Normal chest excursion without splinting or tachypnea; breath sounds clear and equal bilaterally; no wheezes, no rhonchi, no rales, no hypoxia or respiratory distress, speaking full sentences ABD/GI: Normal bowel sounds; non-distended; soft, non-tender, no rebound, no guarding, no peritoneal signs, no hepatosplenomegaly BACK:  The back appears normal and is non-tender to palpation, there is no CVA tenderness EXT: Normal ROM in all joints; non-tender to palpation; no edema; normal capillary refill; no cyanosis, no  calf tenderness or swelling    SKIN: Normal color for age and race; warm; no rash NEURO: Moves all extremities equally PSYCH: The patient's mood and manner are appropriate. Grooming and personal hygiene are appropriate.  MEDICAL DECISION MAKING: Patient here with flulike symptoms.  She is within treatment window for Tamiflu.   Discussed risk and benefits.  Patient states she would like a prescription for this medication.  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  Have advised her to get a flu shot in the future.  Discussed supportive care instructions.  Doubt sepsis, pneumonia, meningitis, bacteremia.  I feel she is safe to be discharged home.  Provided with Tylenol for symptomatic relief prior to discharge home.  At this time, I do not feel there is any life-threatening condition present. I have reviewed and discussed all results (EKG, imaging, lab, urine as appropriate) and exam findings with patient/family. I have reviewed nursing notes and appropriate previous records.  I feel the patient is safe to be discharged home without further emergent workup and can continue workup as an outpatient as needed. Discussed usual and customary return precautions. Patient/family verbalize understanding and are comfortable with this plan.  Outpatient follow-up has been provided as needed. All questions have been answered.     Maryah Marinaro, [name], DO 07/10/18 [phone]

## 2018-07-10 NOTE — ED Triage Notes (Signed)
Pt states that since today she has been having fever, chills, cough, headache, sore throat, body aches.

## 2019-02-16 ENCOUNTER — Other Ambulatory Visit: Payer: Self-pay | Admitting: Physician Assistant

## 2019-02-16 DIAGNOSIS — Z1231 Encounter for screening mammogram for malignant neoplasm of breast: Secondary | ICD-10-CM

## 2019-05-01 IMAGING — CR DG CHEST 2V
2 series · 2 of 2 positions shown · non-contrast
Comparison: 01/09/2017

CLINICAL DATA: Left-sided chest pain radiating to the neck.
Dyspnea. Two days duration.

EXAM:
CHEST  2 VIEW

[chest pa]
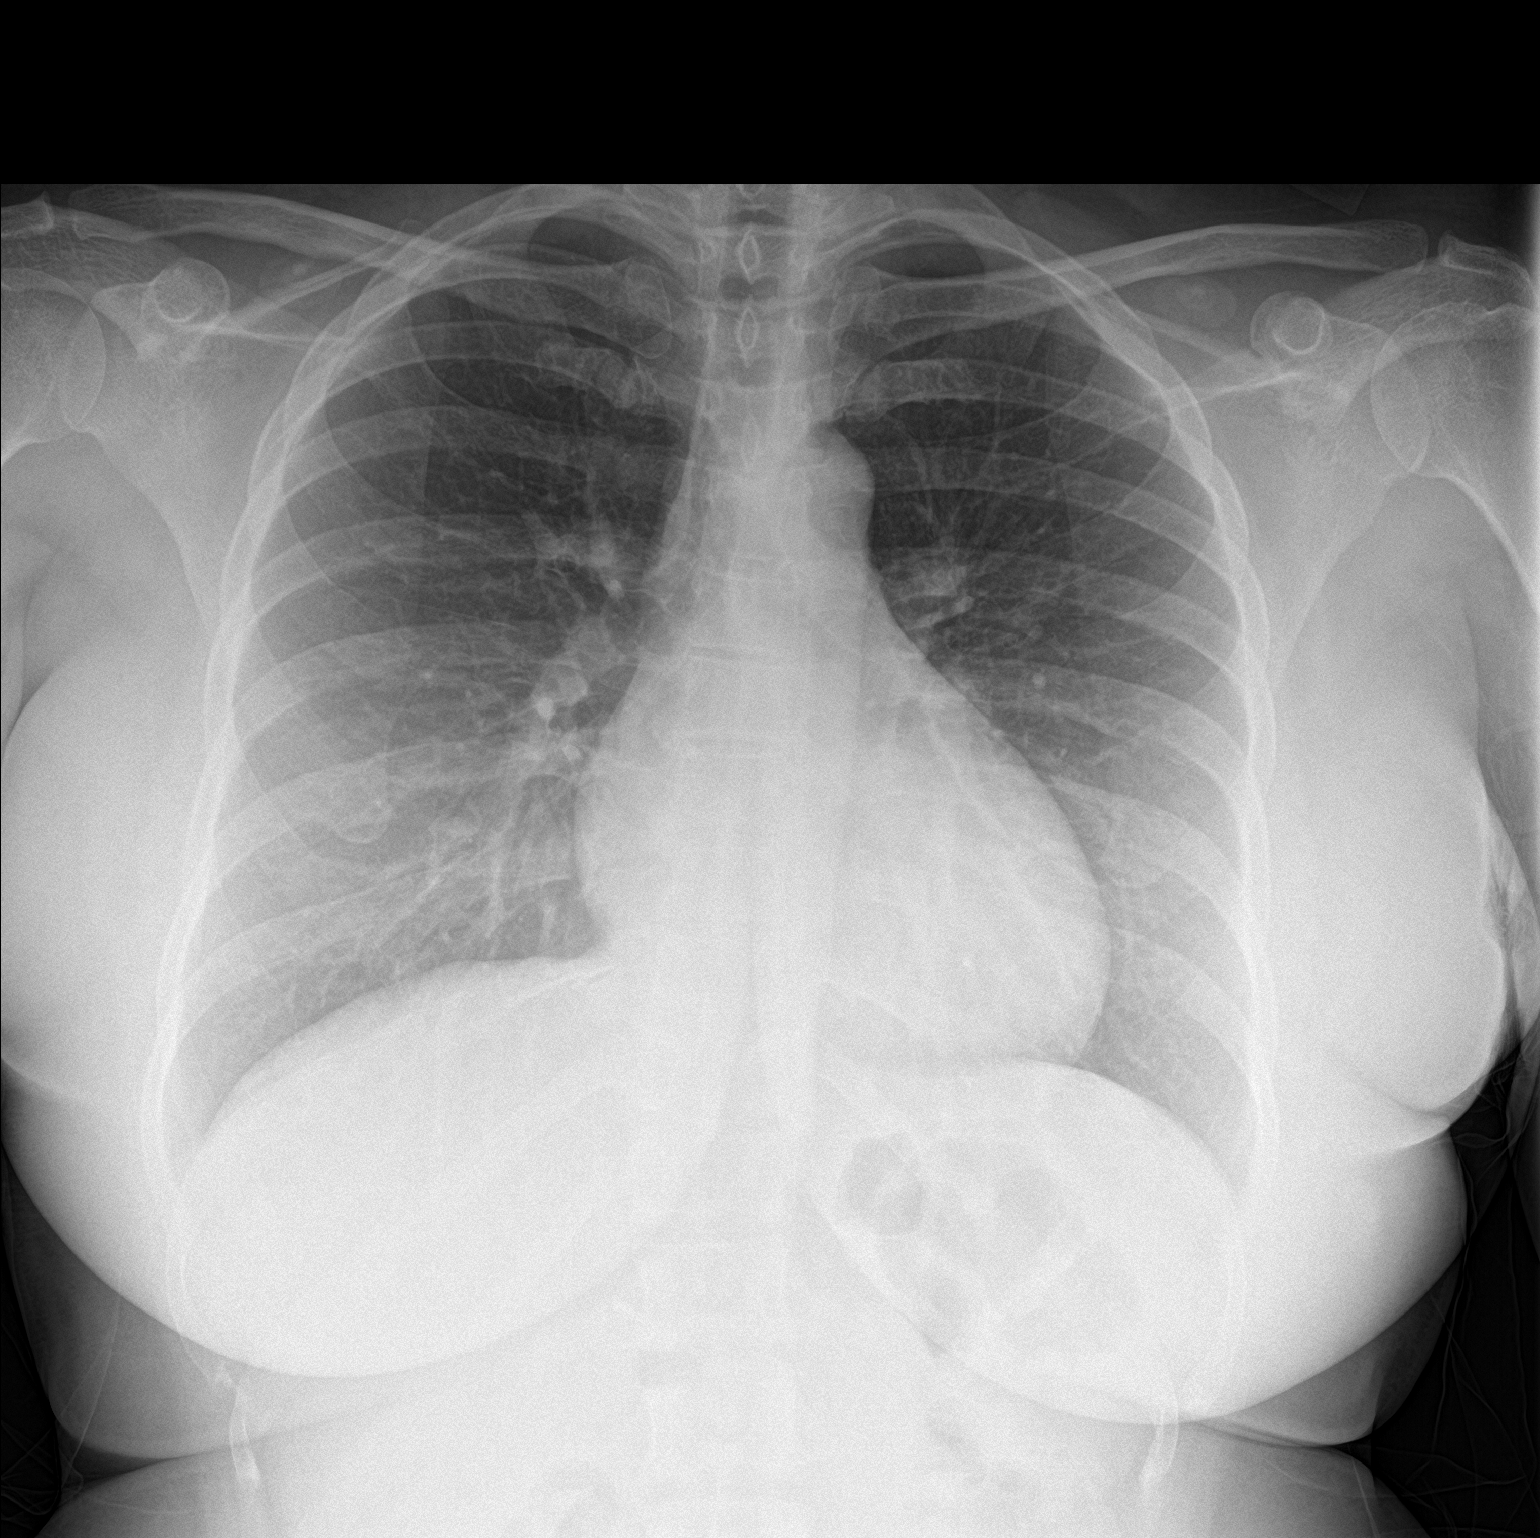

[chest lat]
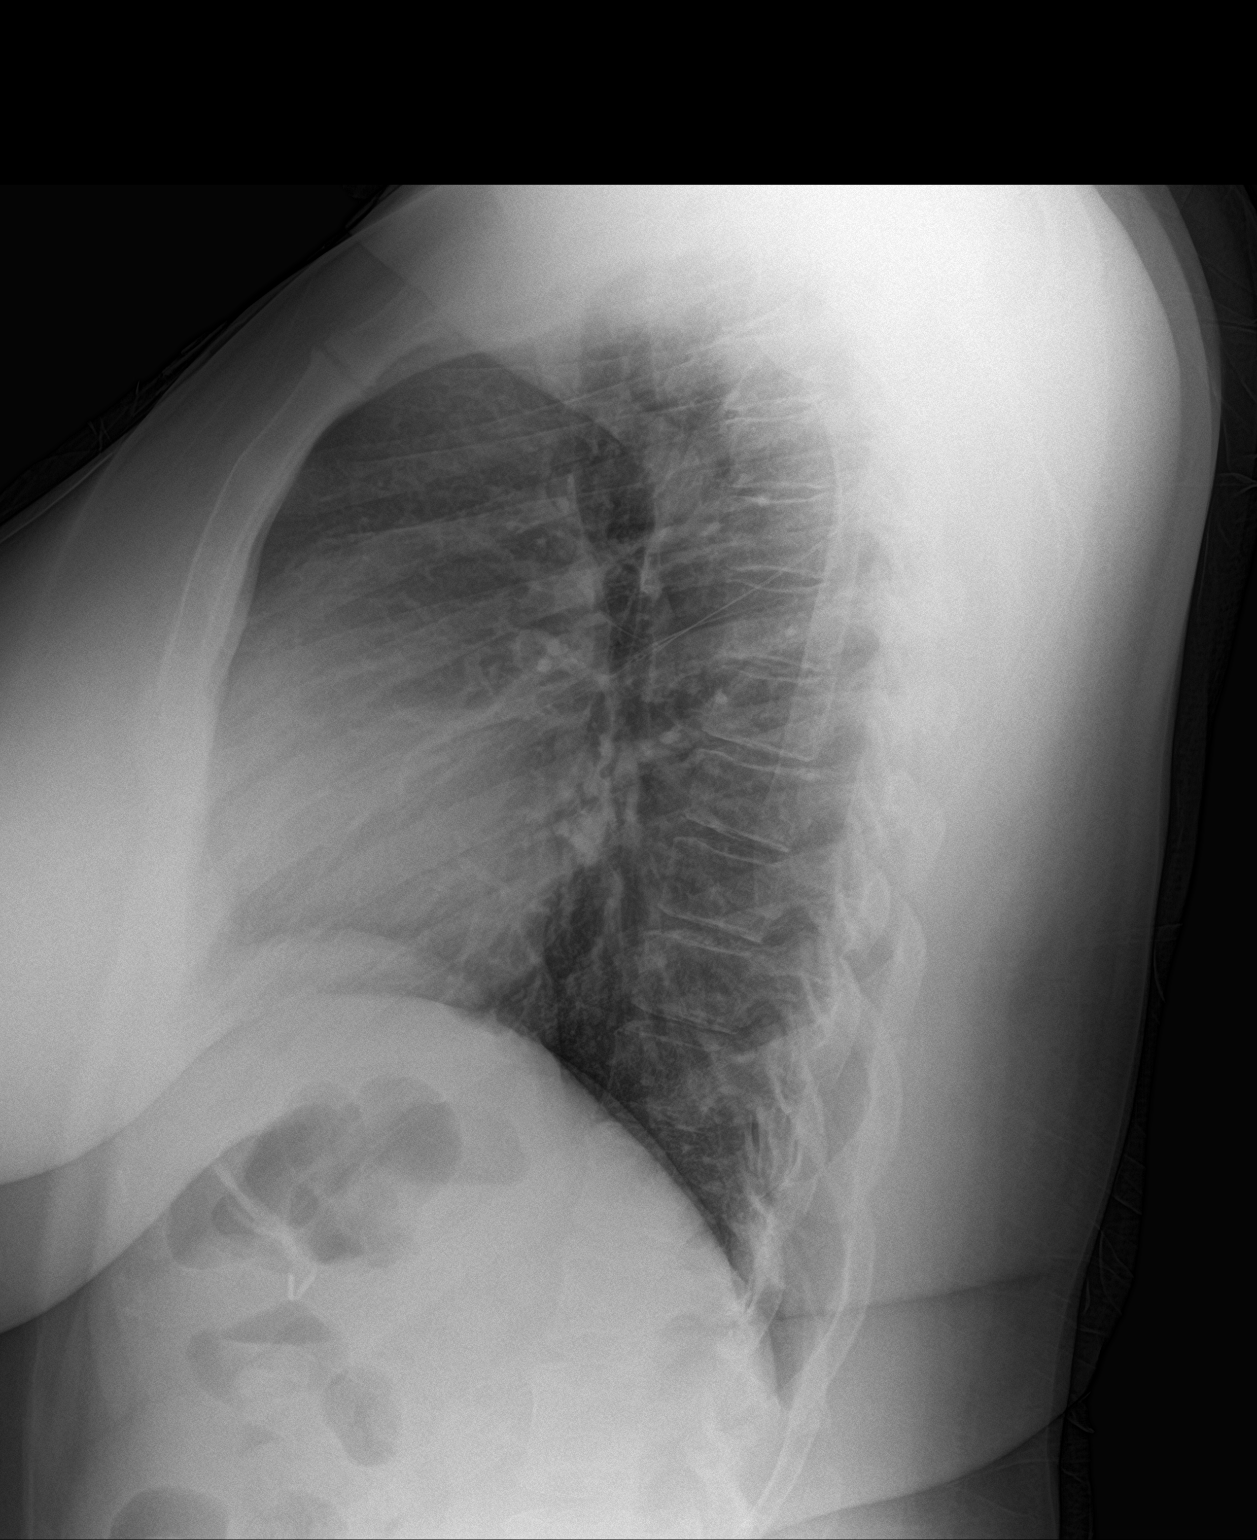

[2 of 2 positions shown; findings below may reference images not displayed]

FINDINGS: The lungs are clear. The pulmonary vasculature is normal. Heart size
is normal. Hilar and mediastinal contours are unremarkable. There is
no pleural effusion.
IMPRESSION: No active cardiopulmonary disease.

## 2020-02-12 ENCOUNTER — Emergency Department (HOSPITAL_COMMUNITY)
Admission: EM | Admit: 2020-02-12 | Discharge: 2020-02-12 | Disposition: A | Discharge status: Home / Self Care | Payer: Medicaid Other | Attending: Emergency Medicine | Admitting: Emergency Medicine

## 2020-02-12 ENCOUNTER — Encounter (HOSPITAL_COMMUNITY): Payer: Self-pay | Admitting: Emergency Medicine

## 2020-02-12 ENCOUNTER — Emergency Department (HOSPITAL_COMMUNITY): Payer: Medicaid Other

## 2020-02-12 ENCOUNTER — Other Ambulatory Visit: Payer: Self-pay

## 2020-02-12 DIAGNOSIS — R079 Chest pain, unspecified: Secondary | ICD-10-CM | POA: Insufficient documentation

## 2020-02-12 DIAGNOSIS — Z5321 Procedure and treatment not carried out due to patient leaving prior to being seen by health care provider: Secondary | ICD-10-CM | POA: Insufficient documentation

## 2020-02-12 DIAGNOSIS — M549 Dorsalgia, unspecified: Secondary | ICD-10-CM | POA: Insufficient documentation

## 2020-02-12 LAB — BASIC METABOLIC PANEL
Anion gap: 10 (ref 5–15)
BUN: 5 mg/dL — ABNORMAL LOW (ref 6–20)
CO2: 23 mmol/L (ref 22–32)
Calcium: 8.7 mg/dL — ABNORMAL LOW (ref 8.9–10.3)
Chloride: 105 mmol/L (ref 98–111)
Creatinine, Ser: 0.69 mg/dL (ref 0.44–1.00)
GFR calc Af Amer: >60 mL/min (ref >60)
GFR calc non Af Amer: >60 mL/min (ref >60)
Glucose, Bld: 93 mg/dL (ref 70–99)
Potassium: 3.5 mmol/L (ref 3.5–5.1)
Sodium: 138 mmol/L (ref 135–145)

## 2020-02-12 LAB — CBC
HCT: 39.9 % (ref 36.0–46.0)
Hemoglobin: 13.1 g/dL (ref 12.0–15.0)
MCH: 31.6 pg (ref 26.0–34.0)
MCHC: 32.8 g/dL (ref 30.0–36.0)
MCV: 96.1 fL (ref 80.0–100.0)
Platelets: 295 10*3/uL (ref 150–400)
RBC: 4.15 MIL/uL (ref 3.87–5.11)
RDW: 11.8 % (ref 11.5–15.5)
WBC: 5.3 10*3/uL (ref 4.0–10.5)
nRBC: 0 % (ref 0.0–0.2)

## 2020-02-12 LAB — I-STAT BETA HCG BLOOD, ED (MC, WL, AP ONLY): I-stat hCG, quantitative: <5 m[IU]/mL (ref <5)

## 2020-02-12 LAB — TROPONIN I (HIGH SENSITIVITY): Troponin I (High Sensitivity): 3 ng/L (ref <18)

## 2020-02-12 NOTE — ED Triage Notes (Addendum)
Onset today developed chest pain 4/10 achy radiating to back with nausea. States pain decreasing without nausea at this time.

## 2020-09-30 ENCOUNTER — Emergency Department (HOSPITAL_COMMUNITY)
Admission: EM | Admit: 2020-09-30 | Discharge: 2020-09-30 | Disposition: A | Discharge status: Home / Self Care | Payer: BC Managed Care – PPO | Attending: Emergency Medicine | Admitting: Emergency Medicine

## 2020-09-30 DIAGNOSIS — R55 Syncope and collapse: Secondary | ICD-10-CM | POA: Diagnosis not present

## 2020-09-30 DIAGNOSIS — R030 Elevated blood-pressure reading, without diagnosis of hypertension: Secondary | ICD-10-CM | POA: Diagnosis not present

## 2020-09-30 DIAGNOSIS — Z5321 Procedure and treatment not carried out due to patient leaving prior to being seen by health care provider: Secondary | ICD-10-CM | POA: Diagnosis not present

## 2020-09-30 NOTE — ED Notes (Signed)
Pt stated that since her BP had went down from when she took it at home she was going home to rest and try to feel better. She did say if she didn't feel better she would come back at a later time. Moving pt OTF.

## 2020-12-03 ENCOUNTER — Encounter (HOSPITAL_COMMUNITY): Payer: Self-pay

## 2020-12-03 ENCOUNTER — Emergency Department (HOSPITAL_COMMUNITY)
Admission: EM | Admit: 2020-12-03 | Discharge: 2020-12-04 | Disposition: A | Discharge status: Home / Self Care | Payer: BC Managed Care – PPO | Attending: Emergency Medicine | Admitting: Emergency Medicine

## 2020-12-03 ENCOUNTER — Other Ambulatory Visit: Payer: Self-pay

## 2020-12-03 DIAGNOSIS — G8918 Other acute postprocedural pain: Secondary | ICD-10-CM | POA: Diagnosis not present

## 2020-12-03 DIAGNOSIS — M25561 Pain in right knee: Secondary | ICD-10-CM | POA: Diagnosis not present

## 2020-12-03 MED ORDER — IBUPROFEN 200 MG PO TABS
600.0000 mg | ORAL_TABLET | Freq: Once | ORAL | Status: AC
  Administered 2020-12-03: 600 mg via ORAL
  Filled 2020-12-03: qty 1

## 2020-12-03 MED ORDER — HYDROMORPHONE HCL 1 MG/ML IJ SOLN
1.0000 mg | Freq: Once | INTRAMUSCULAR | Status: AC
  Administered 2020-12-03: 1 mg via INTRAMUSCULAR
  Filled 2020-12-03: qty 1

## 2020-12-03 NOTE — ED Provider Notes (Signed)
MSE was initiated and I personally evaluated the patient and placed orders (if any) at  10:47 PM on December 03, 2020.  Patient to ED with right knee pain, uncontrolled with oxycodone. Underwent arthroscopy this morning by Dr. Thomasena Edis. Took 2 pain pills around 8:30 - reports pain increasing. Has not called ortho regarding post-op pain.   Today's Vitals   12/03/20 2240 12/03/20 2248  BP: (!) 142/105   Pulse: 77   Resp: 18   Temp: 99.1 F (37.3 C)   TempSrc: Oral   SpO2: 100%   PainSc:  10-Worst pain ever   There is no height or weight on file to calculate BMI.  Appears uncomfortable No significant swelling above or below bandages. Distal pulses intact.   PO/IM Pain medication provided.   The patient appears stable so that the remainder of the MSE may be completed by another provider.   Elpidio Anis, PA-C 12/03/20 2249    Melene Plan, DO 12/03/20 2307

## 2020-12-03 NOTE — ED Triage Notes (Signed)
Right leg pain (post op) since 1930 today, took prescribed pain med but hasnt helped. Pain 10/10.

## 2020-12-04 MED ORDER — KETOROLAC TROMETHAMINE 30 MG/ML IJ SOLN
30.0000 mg | Freq: Once | INTRAMUSCULAR | Status: AC
  Administered 2020-12-04: 30 mg via INTRAMUSCULAR
  Filled 2020-12-04: qty 1

## 2020-12-04 MED ORDER — IBUPROFEN 600 MG PO TABS: 600.0000 mg | ORAL_TABLET | Freq: Four times a day (QID) | ORAL | 0 refills | Status: DC | PRN

## 2020-12-04 NOTE — Discharge Instructions (Addendum)
Thank you for allowing me to care for you today in the Emergency Department.   You can take 600 mg of ibuprofen with food every 6 hours to help with pain and swelling.  Make sure to elevate your leg so that your toes are at or above the level of your heart to help with pain and swelling.  You can apply an ice pack over the dressing to also help with your symptoms.   You can use methocarbamol with ibuprofen to help with muscle pain and spasms.  Continue to take your antibiotic as prescribed.  You can also take your oxycodone pain medication as prescribed by orthopedics.  Make sure that you are not taking extra doses of this medication since it is a narcotic as it can make you too sleepy and have trouble breathing.  You should also not drink alcohol or take any other sedating substances along with this medication.  If your pain remains uncontrollable with this regimen, you can always call Dr. Thomasena Edis office and speak to the after-hours team.  Return to the emergency department if you develop severe redness, swelling in the leg, chest pain, shortness of breath, if you pass out, if your toes turn blue, or if you have other new, concerning symptoms.

## 2020-12-04 NOTE — ED Provider Notes (Signed)
[facility] EMERGENCY DEPARTMENT Provider Note   CSN: [id number] Arrival date & time: 12/03/20  2227     History Chief Complaint  Patient presents with   Post-op Problem    Ashlee Jones is a 45 y.o. female with a history of right knee injury who presents to the emergency department with a chief complaint of right knee pain.  The patient underwent a right knee arthroscopy this afternoon around noon with Dr. Thomasena Edis with orthopedics.  At approximately 1600, the patient reports localized sharp, 10 out of 10, nonradiating severe pain to the right knee.  She took 1 tablet of her prescribed antibiotic and 1 tablet of oxycodone with no improvement in her symptoms.  She was also prescribed methocarbamol and an anti-inflammatory, but did not take these medications.  Around 2000, she reports that pain was increasing so she took 1 tablet of oxycodone and when it did not improve her symptoms she took a second tablet within the next hour.  Pain did not improve, so she came to the emergency department for further evaluation.  She has not followed up with her orthopedist regarding her postoperative pain.  She denies calf or thigh swelling or pain, chest pain, shortness of breath, numbness, weakness, right ankle or knee pain, abdominal pain, vomiting, fever, or chills.  She reports that pain has drastically improved since she was giving pain medication in triage.  The history is provided by the patient and medical records. No language interpreter was used.      History reviewed. No pertinent past medical history.  There are no problems to display for this patient.   Past Surgical History:  Procedure Laterality Date   CHOLECYSTECTOMY       OB History   No obstetric history on file.     History reviewed. No pertinent family history.  Social History   Tobacco Use   Smoking status: Never   Smokeless tobacco: Never  Substance Use Topics   Alcohol use: No   Drug use:  No    Home Medications Prior to Admission medications   Medication Sig Start Date End Date Taking? Authorizing Provider  ibuprofen (ADVIL) 600 MG tablet Take 1 tablet (600 mg total) by mouth every 6 (six) hours as needed. 12/04/20  Yes Lateefa Crosby A, PA-C  famotidine (PEPCID) 20 MG tablet Take 1 tablet (20 mg total) by mouth 2 (two) times daily. 09/19/17   Terrilee Files, MD  ondansetron (ZOFRAN ODT) 4 MG disintegrating tablet Take 1 tablet (4 mg total) by mouth every 6 (six) hours as needed. 07/10/18   Ward, [name], DO  oseltamivir (TAMIFLU) 75 MG capsule Take 1 capsule (75 mg total) by mouth every 12 (twelve) hours. 07/10/18   Ward, [name], DO    Allergies    Patient has no known allergies.  Review of Systems   Review of Systems  Constitutional:  Negative for activity change, chills, diaphoresis and fever.  HENT:  Negative for congestion and sore throat.   Respiratory:  Negative for cough, shortness of breath and wheezing.   Cardiovascular:  Negative for chest pain and palpitations.  Gastrointestinal:  Negative for abdominal pain, blood in stool, diarrhea, nausea and vomiting.  Genitourinary:  Negative for dysuria.  Musculoskeletal:  Positive for arthralgias and myalgias. Negative for back pain, gait problem, neck pain and neck stiffness.  Skin:  Negative for rash.  Allergic/Immunologic: Negative for immunocompromised state.  Neurological:  Negative for seizures, syncope, weakness, numbness and headaches.  Psychiatric/Behavioral:  Negative for confusion.    Physical Exam Updated Vital Signs BP (!) 143/84   Pulse 67   Temp 98.9 F (37.2 C) (Oral)   Resp 14   Ht 5\' 11"  (1.803 m)   Wt 108.9 kg   SpO2 97%   BMI 33.48 kg/m   Physical Exam Vitals and nursing note reviewed.  Constitutional:      General: She is not in acute distress.    Appearance: She is well-developed. She is not ill-appearing, toxic-appearing or diaphoretic.  HENT:     Head: Normocephalic and  atraumatic.  Pulmonary:     Effort: No respiratory distress.     Breath sounds: No stridor. No wheezing, rhonchi or rales.  Chest:     Chest wall: No tenderness.  Abdominal:     General: There is no distension.     Palpations: There is no mass.     Tenderness: There is no abdominal tenderness. There is no right CVA tenderness, left CVA tenderness, guarding or rebound.     Hernia: No hernia is present.  Musculoskeletal:        General: Tenderness present. No swelling, deformity or signs of injury. Normal range of motion.     Cervical back: Normal range of motion.     Right lower leg: No edema.     Left lower leg: No edema.     Comments: Ace wrap and postoperative dressing in place over the right knee.  Muscular compartments in the right thigh and calf are soft.  There is no swelling, redness outside of the dressing.  DP and PT pulses are 2+ and symmetric.  Sensation is intact and equal throughout.  5 of 5 strength against resistance with dorsiflexion and plantarflexion.  She has minimal tenderness to palpation overlying the dressing of the right knee.  No focal tenderness.  Right hip and ankle are nontender with full active and passive range of motion.  Skin:    Coloration: Skin is not jaundiced or pale.  Neurological:     Mental Status: She is alert and oriented to person, place, and time.    ED Results / Procedures / Treatments   Labs (all labs ordered are listed, but only abnormal results are displayed) Labs Reviewed - No data to display  EKG None  Radiology No results found.  Procedures Procedures   Medications Ordered in ED Medications  HYDROmorphone (DILAUDID) injection 1 mg (1 mg Intramuscular Given 12/03/20 2256)  ibuprofen (ADVIL) tablet 600 mg (600 mg Oral Given 12/03/20 2255)  ketorolac (TORADOL) 30 MG/ML injection 30 mg (30 mg Intramuscular Given 12/04/20 0306)    ED Course  I have reviewed the triage vital signs and the nursing notes.  Pertinent labs & imaging  results that were available during my care of the patient were reviewed by me and considered in my medical decision making (see chart for details).    MDM Rules/Calculators/A&P                           45 year old female presenting with right knee pain that began at approximately 1600 after she underwent a right knee arthroscopy with orthopedics at noon.  She was provided with anti-inflammatories and methocarbamol, which she did not take, but when pain did not improve with oxycodone, she came to the emergency department for further evaluation.  She has not touch base with her orthopedics team.  Vital signs stable.  She was given Dilaudid  and ibuprofen several hours ago in triage.  She reports marked improvement with pain at this time.  Her physical exam is overall reassuring.  I have a very low suspicion for DVT given that her procedure was several hours ago.  Doubt compartment syndrome, gout, septic joint did consider obtaining an x-ray of the right knee, but given drastic improvement of her pain and reportedly feeling much improved, I think that it is reasonable to watch and wait at this time.  We will also give Toradol.  She is agreeable with this plan.  Discussed extensively with the patient her home pain regimen, which should also include NSAIDs and encouraged methocarbamol use.  She was also advised to make sure that her orthopedist team knows if her pain is poorly managed, especially if it involves coming to the emergency department.  She is in agreement with this plan.  ER return precautions given.  She is hemodynamically stable and in no acute distress.  Safer discharge home with outpatient follow-up as discussed.  Final Clinical Impression(s) / ED Diagnoses Final diagnoses:  Post-operative pain  Acute pain of right knee    Rx / DC Orders ED Discharge Orders          Ordered    ibuprofen (ADVIL) 600 MG tablet  Every 6 hours PRN        12/04/20 0251             Barkley Boards,  PA-C 12/04/20 0313    Mesner, Barbara Cower, MD 12/04/20 [phone]

## 2020-12-05 ENCOUNTER — Other Ambulatory Visit: Payer: BC Managed Care – PPO

## 2021-10-01 ENCOUNTER — Ambulatory Visit: Payer: Self-pay | Admitting: Nurse Practitioner

## 2022-01-12 ENCOUNTER — Emergency Department (HOSPITAL_BASED_OUTPATIENT_CLINIC_OR_DEPARTMENT_OTHER)
Admission: EM | Admit: 2022-01-12 | Discharge: 2022-01-12 | Disposition: A | Discharge status: Home / Self Care | Payer: 59 | Attending: Emergency Medicine | Admitting: Emergency Medicine

## 2022-01-12 ENCOUNTER — Encounter (HOSPITAL_BASED_OUTPATIENT_CLINIC_OR_DEPARTMENT_OTHER): Payer: Self-pay | Admitting: Emergency Medicine

## 2022-01-12 ENCOUNTER — Emergency Department (HOSPITAL_BASED_OUTPATIENT_CLINIC_OR_DEPARTMENT_OTHER): Payer: 59 | Admitting: Radiology

## 2022-01-12 ENCOUNTER — Other Ambulatory Visit (HOSPITAL_BASED_OUTPATIENT_CLINIC_OR_DEPARTMENT_OTHER): Payer: Self-pay

## 2022-01-12 ENCOUNTER — Other Ambulatory Visit: Payer: Self-pay

## 2022-01-12 DIAGNOSIS — Y92481 Parking lot as the place of occurrence of the external cause: Secondary | ICD-10-CM | POA: Insufficient documentation

## 2022-01-12 DIAGNOSIS — M25511 Pain in right shoulder: Secondary | ICD-10-CM | POA: Insufficient documentation

## 2022-01-12 DIAGNOSIS — M546 Pain in thoracic spine: Secondary | ICD-10-CM | POA: Insufficient documentation

## 2022-01-12 DIAGNOSIS — M25572 Pain in left ankle and joints of left foot: Secondary | ICD-10-CM | POA: Insufficient documentation

## 2022-01-12 MED ORDER — METHOCARBAMOL 500 MG PO TABS: 500.0000 mg | ORAL_TABLET | Freq: Two times a day (BID) | ORAL | 0 refills | Status: DC

## 2022-01-12 MED ORDER — IBUPROFEN 600 MG PO TABS: 600.0000 mg | ORAL_TABLET | Freq: Four times a day (QID) | ORAL | 0 refills | Status: DC | PRN

## 2022-01-12 MED ORDER — IBUPROFEN 400 MG PO TABS: 600.0000 mg | ORAL_TABLET | Freq: Once | ORAL | Status: DC

## 2022-01-12 NOTE — Discharge Instructions (Addendum)

## 2022-01-12 NOTE — ED Notes (Signed)
Patient verbalizes understanding of discharge instructions. Opportunity for questioning and answers were provided. Patient discharged from ED.  °
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
## 2023-04-05 HISTORY — PX: XI ROBOTIC GASTRIC SLEEVE RESECTION: SHX7000

## 2023-07-13 ENCOUNTER — Other Ambulatory Visit: Payer: Self-pay

## 2023-07-13 ENCOUNTER — Encounter (HOSPITAL_BASED_OUTPATIENT_CLINIC_OR_DEPARTMENT_OTHER): Payer: Self-pay | Admitting: Emergency Medicine

## 2023-07-13 ENCOUNTER — Emergency Department (HOSPITAL_BASED_OUTPATIENT_CLINIC_OR_DEPARTMENT_OTHER)
Admission: EM | Admit: 2023-07-13 | Discharge: 2023-07-13 | Disposition: A | Discharge status: Home / Self Care | Payer: Commercial Managed Care - PPO | Attending: Emergency Medicine | Admitting: Emergency Medicine

## 2023-07-13 DIAGNOSIS — Z79899 Other long term (current) drug therapy: Secondary | ICD-10-CM | POA: Insufficient documentation

## 2023-07-13 DIAGNOSIS — M79604 Pain in right leg: Secondary | ICD-10-CM | POA: Diagnosis not present

## 2023-07-13 LAB — BASIC METABOLIC PANEL
Anion gap: 7 (ref 5–15)
BUN: 9 mg/dL (ref 6–20)
CO2: 26 mmol/L (ref 22–32)
Calcium: 9.1 mg/dL (ref 8.9–10.3)
Chloride: 107 mmol/L (ref 98–111)
Creatinine, Ser: 0.7 mg/dL (ref 0.44–1.00)
GFR, Estimated: >60 mL/min (ref >60)
Glucose, Bld: 86 mg/dL (ref 70–99)
Potassium: 3.7 mmol/L (ref 3.5–5.1)
Sodium: 140 mmol/L (ref 135–145)

## 2023-07-13 LAB — CBC WITH DIFFERENTIAL/PLATELET
Abs Immature Granulocytes: 0.01 10*3/uL (ref 0.00–0.07)
Basophils Absolute: 0.1 10*3/uL (ref 0.0–0.1)
Basophils Relative: 1 %
Eosinophils Absolute: 0.1 10*3/uL (ref 0.0–0.5)
Eosinophils Relative: 1 %
HCT: 43 % (ref 36.0–46.0)
Hemoglobin: 14 g/dL (ref 12.0–15.0)
Immature Granulocytes: 0 %
Lymphocytes Relative: 45 %
Lymphs Abs: 2.8 10*3/uL (ref 0.7–4.0)
MCH: 30.8 pg (ref 26.0–34.0)
MCHC: 32.6 g/dL (ref 30.0–36.0)
MCV: 94.5 fL (ref 80.0–100.0)
Monocytes Absolute: 0.5 10*3/uL (ref 0.1–1.0)
Monocytes Relative: 8 %
Neutro Abs: 2.8 10*3/uL (ref 1.7–7.7)
Neutrophils Relative %: 45 %
Platelets: 290 10*3/uL (ref 150–400)
RBC: 4.55 MIL/uL (ref 3.87–5.11)
RDW: 12.5 % (ref 11.5–15.5)
WBC: 6.2 10*3/uL (ref 4.0–10.5)
nRBC: 0 % (ref 0.0–0.2)

## 2023-07-13 LAB — CK: Total CK: 46 U/L (ref 38–234)

## 2023-07-13 LAB — MAGNESIUM: Magnesium: 2.1 mg/dL (ref 1.7–2.4)

## 2023-07-13 LAB — D-DIMER, QUANTITATIVE: D-Dimer, Quant: 0.27 ug{FEU}/mL (ref 0.00–0.50)

## 2023-07-13 MED ORDER — KETOROLAC TROMETHAMINE 15 MG/ML IJ SOLN
15.0000 mg | Freq: Once | INTRAMUSCULAR | Status: AC
  Administered 2023-07-13: 15 mg via INTRAVENOUS
  Filled 2023-07-13: qty 1

## 2023-07-13 NOTE — ED Triage Notes (Signed)
 Right leg pain from hip to calf Started yesterday.  Pain while sitting but worse when standing/ walking. Seen at [facility] medical given toradol without relief

## 2023-07-13 NOTE — Discharge Instructions (Signed)
 You were evaluated in the emergency room for right leg pain.  Your lab work did not show any significant abnormality or suggest a blood clot.  I suspect your pain is likely musculoskeletal such as a strain or sciatica.  Short course of muscle relaxants has been sent into your pharmacy.  You have additionally been provided contact information for orthopedic doctor.  Please call make an appointment at your earliest convenience.  You may additionally use Tylenol 1000 mg every 4-6 hours up to 3 times a day and/or ibuprofen 600 to 800 mg every 4-6 hours up to 3 times a day on a full stomach.  If you experience any new or worsening symptoms including extreme worsening of pain, numbness, loss of bowel or bladder control please return to the emergency room.

## 2023-07-13 NOTE — ED Provider Notes (Signed)
 [place] EMERGENCY DEPARTMENT AT [facility] Provider Note   CSN: [id number] Arrival date & time: 07/13/23  1333     History  Chief Complaint  Patient presents with   Leg Pain    Ashlee Jones is a 48 y.o. female who presents with atraumatic right leg pain over the past few days.  Denies any numbness or tingling no urinary incontinence.  No back pain.  Pain is worse with ambulating.  States this starts around her buttock area and radiates down to her calf.   Leg Pain    History reviewed. No pertinent past medical history.   Home Medications Prior to Admission medications   Medication Sig Start Date End Date Taking? Authorizing Provider  famotidine (PEPCID) 20 MG tablet Take 1 tablet (20 mg total) by mouth 2 (two) times daily. 09/19/17   Terrilee Files, MD  ibuprofen (ADVIL) 600 MG tablet Take 1 tablet (600 mg total) by mouth every 6 (six) hours as needed. 01/12/22   Rosezella Rumpf, PA-C  methocarbamol (ROBAXIN) 500 MG tablet Take 1 tablet (500 mg total) by mouth 2 (two) times daily. 01/12/22   Rosezella Rumpf, PA-C  ondansetron (ZOFRAN ODT) 4 MG disintegrating tablet Take 1 tablet (4 mg total) by mouth every 6 (six) hours as needed. 07/10/18   Ward, [name], DO  oseltamivir (TAMIFLU) 75 MG capsule Take 1 capsule (75 mg total) by mouth every 12 (twelve) hours. Patient not taking: Reported on 07/13/2023 07/10/18   Ward, [name], DO      Allergies    Patient has no known allergies.    Review of Systems   Review of Systems  Musculoskeletal:  Positive for myalgias.    Physical Exam Updated Vital Signs BP (!) 140/114   Pulse 93   Temp 98 F (36.7 C)   Resp 16   LMP 07/03/2023   SpO2 98%  Physical Exam Vitals and nursing note reviewed.  Constitutional:      General: She is not in acute distress.    Appearance: She is well-developed.  HENT:     Head: Normocephalic and atraumatic.  Eyes:     Conjunctiva/sclera: Conjunctivae normal.  Cardiovascular:      Rate and Rhythm: Normal rate and regular rhythm.     Heart sounds: No murmur heard. Pulmonary:     Effort: Pulmonary effort is normal. No respiratory distress.     Breath sounds: Normal breath sounds. No wheezing or rales.  Abdominal:     Palpations: Abdomen is soft.     Tenderness: There is no abdominal tenderness.  Musculoskeletal:     Cervical back: Neck supple.     Comments: Examination of right lower extremity demonstrates no point of tenderness no significant swelling or ecchymosis there is no overlying erythema, warmth, induration, crepitus or fluctuance.  Compartments are soft.  She has 5 out of 5 lower extremity strength.  Sensation is intact.  DP pulses are symmetric.  She was able to ambulate with some discomfort.  Skin:    General: Skin is warm and dry.     Capillary Refill: Capillary refill takes less than 2 seconds.  Neurological:     General: No focal deficit present.     Mental Status: She is alert.     Cranial Nerves: No cranial nerve deficit.  Psychiatric:        Mood and Affect: Mood normal.     ED Results / Procedures / Treatments   Labs (all labs ordered  are listed, but only abnormal results are displayed) Labs Reviewed  CBC WITH DIFFERENTIAL/PLATELET  BASIC METABOLIC PANEL  D-DIMER, QUANTITATIVE  MAGNESIUM  CK    EKG None  Radiology No results found.  Procedures Procedures    Medications Ordered in ED Medications  ketorolac (TORADOL) 15 MG/ML injection 15 mg (has no administration in time range)    ED Course/ Medical Decision Making/ A&P                                 Medical Decision Making Amount and/or Complexity of Data Reviewed Labs: ordered.   This patient presents to the ED with chief complaint(s) of right lower extremity pain.  The complaint involves an extensive differential diagnosis and also carries with it a high risk of complications and morbidity.   pertinent past medical history as listed in HPI  The differential  diagnosis includes  MSK, underlying fracture, epidural hematoma/abscess, cauda equina syndrome, spinal stenosis, spinal malignancy, discitis, arthritis, spondylosis, DVT, sciatica, rhabdomyolysis The initial plan is to  Will obtain basic labs, ultrasound not available today Additional history obtained: Reviewed EMR  Initial Assessment:   Hemodynamically stable patient presenting with leg pain.  On exam patient was nontender, without swelling, erythema, ecchymosis, induration, crepitus, fluctuance, erythema or warmth. No neurological deficits and normal neuro exam.  Patient can walk but states is painful.  No loss of bowel or bladder control.  No concern for cauda equina.  No red flag symptoms.  No concern for infectious process.  Compartments are soft do not suspect compartment syndrome.  Lab work is unremarkable.  CK and D-dimer are within normal limits.  Overall most consistent with musculoskeletal etiology.  Will send in short course of muscle relaxants and refer to Ortho.  Independent ECG interpretation:  none  Independent labs interpretation:  The following labs were independently interpreted:  CK, mg and D-dimer within normal limits, CBC and BMP unremarkable  Independent visualization and interpretation of imaging: none  Treatment and Reassessment: Given 15 of Toradol IV  Consultations obtained:   none  Disposition:   Patient will be discharged home.  Provided short course of muscle relaxants.  Provided referral for Ortho.  Social Determinants of Health:   none  This note was dictated with voice recognition software.  Despite best efforts at proofreading, errors may have occurred which can change the documentation meaning.          Final Clinical Impression(s) / ED Diagnoses Final diagnoses:  Right leg pain    Rx / DC Orders ED Discharge Orders     None         Fabienne Bruns 07/13/23 1825    Ernie Avena, MD 07/13/23 [phone]

## 2023-12-30 DIAGNOSIS — Z1159 Encounter for screening for other viral diseases: Secondary | ICD-10-CM | POA: Diagnosis not present

## 2023-12-30 DIAGNOSIS — E559 Vitamin D deficiency, unspecified: Secondary | ICD-10-CM | POA: Diagnosis not present

## 2023-12-30 DIAGNOSIS — Z0000 Encounter for general adult medical examination without abnormal findings: Secondary | ICD-10-CM | POA: Diagnosis not present

## 2023-12-30 DIAGNOSIS — R5383 Other fatigue: Secondary | ICD-10-CM | POA: Diagnosis not present

## 2023-12-30 DIAGNOSIS — Z3200 Encounter for pregnancy test, result unknown: Secondary | ICD-10-CM | POA: Diagnosis not present

## 2024-01-07 ENCOUNTER — Other Ambulatory Visit: Payer: Self-pay

## 2024-01-07 MED ORDER — ACETAMINOPHEN 500 MG PO TABS
500.0000 mg | ORAL_TABLET | ORAL | 0 refills | Status: DC
  Filled 2024-01-07: qty 24, 4d supply, fill #0

## 2024-01-07 MED ORDER — IBUPROFEN 600 MG PO TABS
600.0000 mg | ORAL_TABLET | Freq: Four times a day (QID) | ORAL | 0 refills | Status: DC | PRN
  Filled 2024-01-07: qty 20, 5d supply, fill #0

## 2024-01-07 MED ORDER — CLINDAMYCIN HCL 150 MG PO CAPS
150.0000 mg | ORAL_CAPSULE | Freq: Three times a day (TID) | ORAL | 0 refills | Status: DC
  Filled 2024-01-07: qty 21, 7d supply, fill #0

## 2024-01-14 ENCOUNTER — Other Ambulatory Visit: Payer: Self-pay | Admitting: Nurse Practitioner

## 2024-01-14 DIAGNOSIS — Z1231 Encounter for screening mammogram for malignant neoplasm of breast: Secondary | ICD-10-CM

## 2024-03-21 ENCOUNTER — Telehealth: Payer: Self-pay

## 2024-03-21 NOTE — Telephone Encounter (Signed)
 A message has been left for the patient to call back to sch a new patient appt.   Copied from CRM [id number]. Topic: Appointments - Scheduling Inquiry for Clinic >> Mar 15, 2024  3:41 PM Miquel SAILOR wrote: Reason for CRM: PT New EST care only want [facility] due to she works across the way. No available PCP on schedule. Needs call back on schedule Est care visit. [phone]

## 2024-03-22 ENCOUNTER — Ambulatory Visit: Payer: Self-pay

## 2024-03-22 NOTE — Telephone Encounter (Signed)
 FYI Only or Action Required?: FYI only for provider: new pt.  Patient was last seen in primary care on NA.  Called Nurse Triage reporting Hypertension.  Symptoms began several days ago.  Interventions attempted: Rest, hydration, or home remedies.  Symptoms are: gradually worsening.  Triage Disposition: No disposition on file.  Patient/caregiver understands and will follow disposition?:   Copied from CRM [id number]. Topic: Clinical - Red Word Triage >> Mar 22, 2024  3:01 PM Charlet HERO wrote: Red Word that prompted transfer to Nurse Triage: Patient is calling about high blood pressure 142/90, she also has extreme fatigue. No pcp calling to schedule with [facility] Reason for Disposition  [1] Systolic BP >= 130 OR Diastolic >= 80 AND [2] taking BP medications  Answer Assessment - Initial Assessment Questions Pt calling to establish care at [facility]. Was on BP meds- hydrochlorothiazide 12.5 daily but it was still high so doubled the dose to 25mg .  Ran out of medication recently  Fatigue- headache last few days but not today New pt appt not until late January and placed on the waitlist. Advised [facility] or [facility] for interim BP management. She understands and will attempt [facility] this week.  1. BLOOD PRESSURE: What is your blood pressure? Did you take at least two measurements 5 minutes apart?     142/90- a couple days ago  2. ONSET: When did you take your blood pressure?     Ongoing issue 3. HOW: How did you take your blood pressure? (e.g., automatic home BP monitor, visiting nurse)     Wrist cuff 4. HISTORY: Do you have a history of high blood pressure?     yes 5. MEDICINES: Are you taking any medicines for blood pressure? Have you missed any doses recently?     Hydrochlorothiazide- ran out  6. OTHER SYMPTOMS: Do you have any symptoms? (e.g., blurred vision, chest pain, difficulty breathing, headache, weakness)     Headache and  fatigue  Protocols used: Blood Pressure - High-A-AH

## 2024-03-27 ENCOUNTER — Ambulatory Visit (INDEPENDENT_AMBULATORY_CARE_PROVIDER_SITE_OTHER): Admitting: Student

## 2024-03-27 VITALS — BP 126/86 | HR 82 | Temp 98.5°F | Ht 71.0 in | Wt 214.8 lb

## 2024-03-27 DIAGNOSIS — I10 Essential (primary) hypertension: Secondary | ICD-10-CM | POA: Diagnosis not present

## 2024-03-27 DIAGNOSIS — Z1231 Encounter for screening mammogram for malignant neoplasm of breast: Secondary | ICD-10-CM | POA: Diagnosis not present

## 2024-03-27 DIAGNOSIS — Z9884 Bariatric surgery status: Secondary | ICD-10-CM | POA: Diagnosis not present

## 2024-03-27 DIAGNOSIS — Z0000 Encounter for general adult medical examination without abnormal findings: Secondary | ICD-10-CM | POA: Insufficient documentation

## 2024-03-27 NOTE — Assessment & Plan Note (Signed)
 Blood pressure elevated at 143/82 mmHg, rechecked is 126/86. Off hydrochlorothiazide for two weeks. Discussed with her to keep a blood pressure log at home and if continuously elevated then can consider restarting her hydrochlorothiazide at 12.5mg .  - Discussed home blood pressure monitoring and log.

## 2024-03-27 NOTE — Assessment & Plan Note (Signed)
 Order placed for screening mammogram

## 2024-03-27 NOTE — Progress Notes (Unsigned)
 CC: Establishing Care  HPI:  Ashlee Jones is a 48 y.o. female living with a history stated below and presents today for establishing care. Please see problem based assessment and plan for additional details.  Discussed the use of AI scribe software for clinical note transcription with the patient, who gave verbal consent to proceed.  History of Present Illness Ashlee Jones is a 48 year old female with hypertension who presents for medication management and follow-up after bariatric surgery.  She has a history of hypertension and has been taking hydrochlorothiazide for approximately two years. She was taking two tablets as one was not effective, but she has been out of the medication for about two weeks. She sometimes checks her blood pressure at home and at work when not feeling well.  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 She wants to lose more weight and has not followed up with her surgeon, Dr. Dela, as planned at the one-year mark.  No history of high cholesterol or diabetes. She takes omeprazole 40 mg nightly for heartburn. No known allergies.  She lives with her husband and five children. She does not smoke, uses alcohol socially, and denies any illicit drug use.    No past medical history on file.  No current outpatient medications on file prior to visit.   No current facility-administered medications on file prior to visit.    No family history on file.  Social History   Socioeconomic History   Marital status: Married    Spouse name: Not on file   Number of children: Not on file   Years of education: Not on file   Highest education level: Associate degree: academic program  Occupational History   Not on file  Tobacco Use   Smoking status: Never   Smokeless tobacco: Never  Substance and Sexual Activity   Alcohol use: No   Drug use: No   Sexual activity: Not on file  Other Topics Concern   Not on file  Social  History Narrative   Not on file   Social Drivers of Health   Financial Resource Strain: Patient Declined (03/24/2024)   Overall Financial Resource Strain (CARDIA)    Difficulty of Paying Living Expenses: Patient declined  Food Insecurity: Patient Declined (03/24/2024)   Hunger Vital Sign    Worried About Running Out of Food in the Last Year: Patient declined    Ran Out of Food in the Last Year: Patient declined  Transportation Needs: No Transportation Needs (03/24/2024)   PRAPARE - Administrator, Civil Service (Medical): No    Lack of Transportation (Non-Medical): No  Physical Activity: Insufficiently Active (03/24/2024)   Exercise Vital Sign    Days of Exercise per Week: 1 day    Minutes of Exercise per Session: 30 min  Stress: No Stress Concern Present (04/05/2023)   Received from [facility] - Occupational Stress Questionnaire    Feeling of Stress : Not at all  Social Connections: Moderately Isolated (03/24/2024)   Social Connection and Isolation Panel    Frequency of Communication with Friends and Family: More than three times a week    Frequency of Social Gatherings with Friends and Family: Never    Attends Religious Services: Never    Database Administrator or Organizations: No    Attends Banker Meetings: Not on file    Marital Status: Married  Intimate Partner Violence: Not At Risk (04/05/2023)  Received from [facility]   HITS    Over the last 12 months how often did your partner physically hurt you?: Never    Over the last 12 months how often did your partner insult you or talk down to you?: Never    Over the last 12 months how often did your partner threaten you with physical harm?: Never    Over the last 12 months how often did your partner scream or curse at you?: Never    Review of Systems: ROS negative except for what is noted on the assessment and plan.  Vitals:   03/27/24 0849 03/27/24  0909  BP: (!) 143/82 126/86  Pulse: 79 82  Temp: 98.5 F (36.9 C)   TempSrc: Oral   SpO2: 100%   Weight: 214 lb 12.8 oz (97.4 kg)   Height: 5' 11 (1.803 m)     Physical Exam: Constitutional: well-appearing female  in no acute distress HENT: normocephalic atraumatic, mucous membranes moist Eyes: conjunctiva non-erythematous Neck: supple Cardiovascular: regular rate and rhythm, no m/r/g Pulmonary/Chest: normal work of breathing on room air, lungs clear to auscultation bilaterally Abdominal: soft, non-tender, non-distended MSK: normal bulk and tone Neurological: alert & oriented x 3, 5/5 strength in bilateral upper and lower extremities, normal gait Skin: warm and dry Psych: normal mood and affect  Assessment & Plan:   No problem-specific Assessment & Plan notes found for this encounter.   Assessment and Plan Assessment & Plan Essential hypertension Blood pressure elevated at 143/82 mmHg. Off hydrochlorothiazide for two weeks. Office readings may be elevated. - Rechecked blood pressure in office. - Discussed home blood pressure monitoring and log.  Screening mammogram Due for screening mammogram. No issues on last mammogram. - Ordered screening mammogram.  Status post sleeve gastrectomy Dissatisfied with weight loss progress. No follow-up with surgeon. - Provided surgeon's contact for follow-up.       Patient {GC/[id number]::discussed with,seen with} Dr. {[id number]::Tpoopjfd,Z. Hoffman,Winfrey,Narendra,Chun,Guilloud,Lau,Machen}    Nalla Purdy, M.D. [facility] Internal Medicine, PGY-3 Pager: [phone] Date 03/27/2024 Time 9:35 AM

## 2024-03-27 NOTE — Assessment & Plan Note (Signed)
 Follows with Dr. Joseph of [facility], due for a follow up. Seems to have lost about 50 pounds with the surgery so far. Encouraged her to reach out to her surgeon for follow up.

## 2024-03-27 NOTE — Patient Instructions (Addendum)
 Thank you so much for coming to the clinic today!   Make sure to keep a blood pressure log and send me a message on MyChart if it's consistently high  We're working on obtaining your records from [place], when we do I'll let you know if there's anything else we need to do!  If you have any questions please feel free to the call the clinic at anytime at [phone]. It was a pleasure seeing you!  Best, Dr. Caedin Mogan

## 2024-03-28 NOTE — Assessment & Plan Note (Signed)
 Working on obtaining records from [facility] where she had her labs done.

## 2024-03-28 NOTE — Progress Notes (Signed)
 Internal Medicine Clinic Attending  Case discussed with the resident at the time of the visit.  We reviewed the resident's history and exam and pertinent patient test results.  I agree with the assessment, diagnosis, and plan of care documented in the resident's note.

## 2024-04-14 DIAGNOSIS — K912 Postsurgical malabsorption, not elsewhere classified: Secondary | ICD-10-CM | POA: Diagnosis not present

## 2024-04-14 DIAGNOSIS — Z903 Acquired absence of stomach [part of]: Secondary | ICD-10-CM | POA: Diagnosis not present

## 2024-05-05 ENCOUNTER — Other Ambulatory Visit: Payer: Self-pay

## 2024-05-05 MED ORDER — PHENTERMINE HCL 15 MG PO CAPS
15.0000 mg | ORAL_CAPSULE | Freq: Every day | ORAL | 1 refills | Status: AC
  Filled 2024-05-05: qty 30, 30d supply, fill #0

## 2024-05-15 ENCOUNTER — Other Ambulatory Visit: Payer: Self-pay

## 2024-05-15 MED ORDER — PHENTERMINE HCL 30 MG PO CAPS
30.0000 mg | ORAL_CAPSULE | Freq: Every day | ORAL | 1 refills | Status: AC
  Filled 2024-05-15: qty 30, 30d supply, fill #0
  Filled 2024-06-20: qty 30, 30d supply, fill #1

## 2024-06-09 ENCOUNTER — Ambulatory Visit (HOSPITAL_BASED_OUTPATIENT_CLINIC_OR_DEPARTMENT_OTHER): Admitting: Family Medicine

## 2024-06-21 ENCOUNTER — Other Ambulatory Visit: Payer: Self-pay
